# Patient Record
Sex: Female | Born: 1965 | Race: White | Hispanic: No | Marital: Married | Smoking: Never smoker
Health system: Southern US, Community
[De-identification: ages and names within clinical notes are randomized; demographics above are authoritative.]

## PROBLEM LIST (undated history)

## (undated) DIAGNOSIS — F419 Anxiety disorder, unspecified: Secondary | ICD-10-CM

## (undated) DIAGNOSIS — T753XXA Motion sickness, initial encounter: Secondary | ICD-10-CM

## (undated) DIAGNOSIS — Z9889 Other specified postprocedural states: Secondary | ICD-10-CM

## (undated) DIAGNOSIS — T4145XA Adverse effect of unspecified anesthetic, initial encounter: Secondary | ICD-10-CM

## (undated) DIAGNOSIS — R112 Nausea with vomiting, unspecified: Secondary | ICD-10-CM

## (undated) DIAGNOSIS — C801 Malignant (primary) neoplasm, unspecified: Secondary | ICD-10-CM

## (undated) DIAGNOSIS — R51 Headache: Secondary | ICD-10-CM

## (undated) DIAGNOSIS — F32A Depression, unspecified: Secondary | ICD-10-CM

## (undated) DIAGNOSIS — F329 Major depressive disorder, single episode, unspecified: Secondary | ICD-10-CM

## (undated) DIAGNOSIS — T8859XA Other complications of anesthesia, initial encounter: Secondary | ICD-10-CM

## (undated) HISTORY — PX: TONSILLECTOMY: SUR1361

---

## 1998-12-02 ENCOUNTER — Other Ambulatory Visit: Admission: RE | Admit: 1998-12-02 | Discharge: 1998-12-02 | Payer: Self-pay | Admitting: Obstetrics and Gynecology

## 2000-03-15 ENCOUNTER — Other Ambulatory Visit: Admission: RE | Admit: 2000-03-15 | Discharge: 2000-03-15 | Payer: Self-pay | Admitting: Obstetrics and Gynecology

## 2001-04-18 ENCOUNTER — Other Ambulatory Visit: Admission: RE | Admit: 2001-04-18 | Discharge: 2001-04-18 | Payer: Self-pay | Admitting: Obstetrics and Gynecology

## 2002-06-09 ENCOUNTER — Other Ambulatory Visit: Admission: RE | Admit: 2002-06-09 | Discharge: 2002-06-09 | Payer: Self-pay | Admitting: Obstetrics and Gynecology

## 2003-06-12 ENCOUNTER — Other Ambulatory Visit: Admission: RE | Admit: 2003-06-12 | Discharge: 2003-06-12 | Payer: Self-pay | Admitting: Obstetrics and Gynecology

## 2003-06-15 ENCOUNTER — Encounter (INDEPENDENT_AMBULATORY_CARE_PROVIDER_SITE_OTHER): Payer: Self-pay | Admitting: *Deleted

## 2003-06-15 ENCOUNTER — Encounter: Admission: RE | Admit: 2003-06-15 | Discharge: 2003-06-15 | Payer: Self-pay | Admitting: Obstetrics and Gynecology

## 2003-06-15 ENCOUNTER — Encounter (INDEPENDENT_AMBULATORY_CARE_PROVIDER_SITE_OTHER): Payer: Self-pay | Admitting: Diagnostic Radiology

## 2003-06-15 ENCOUNTER — Encounter: Payer: Self-pay | Admitting: Obstetrics and Gynecology

## 2003-07-05 ENCOUNTER — Encounter: Admission: RE | Admit: 2003-07-05 | Discharge: 2003-07-05 | Payer: Self-pay | Admitting: Oncology

## 2003-07-11 ENCOUNTER — Encounter (HOSPITAL_COMMUNITY): Admission: RE | Admit: 2003-07-11 | Discharge: 2003-10-09 | Payer: Self-pay | Admitting: General Surgery

## 2003-07-13 ENCOUNTER — Encounter: Payer: Self-pay | Admitting: Cardiology

## 2003-07-13 ENCOUNTER — Ambulatory Visit: Admission: RE | Admit: 2003-07-13 | Discharge: 2003-07-13 | Payer: Self-pay | Admitting: Oncology

## 2003-07-19 ENCOUNTER — Ambulatory Visit (HOSPITAL_COMMUNITY): Admission: RE | Admit: 2003-07-19 | Discharge: 2003-07-19 | Payer: Self-pay | Admitting: Surgery

## 2003-07-19 ENCOUNTER — Ambulatory Visit (HOSPITAL_BASED_OUTPATIENT_CLINIC_OR_DEPARTMENT_OTHER): Admission: RE | Admit: 2003-07-19 | Discharge: 2003-07-19 | Payer: Self-pay | Admitting: Surgery

## 2003-09-08 HISTORY — PX: MASTECTOMY W/ SENTINEL NODE BIOPSY: SHX2001

## 2003-09-08 HISTORY — PX: SIMPLE MASTECTOMY: SHX2409

## 2003-09-08 HISTORY — PX: BREAST RECONSTRUCTION: SHX9

## 2003-09-17 ENCOUNTER — Inpatient Hospital Stay (HOSPITAL_COMMUNITY): Admission: RE | Admit: 2003-09-17 | Discharge: 2003-09-19 | Payer: Self-pay | Admitting: Surgery

## 2003-09-17 ENCOUNTER — Encounter (INDEPENDENT_AMBULATORY_CARE_PROVIDER_SITE_OTHER): Payer: Self-pay | Admitting: *Deleted

## 2003-11-21 ENCOUNTER — Ambulatory Visit: Admission: RE | Admit: 2003-11-21 | Discharge: 2003-12-25 | Payer: Self-pay | Admitting: Radiation Oncology

## 2004-05-28 ENCOUNTER — Ambulatory Visit (HOSPITAL_COMMUNITY): Admission: RE | Admit: 2004-05-28 | Discharge: 2004-05-28 | Payer: Self-pay | Admitting: Oncology

## 2004-07-08 ENCOUNTER — Ambulatory Visit: Payer: Self-pay | Admitting: Oncology

## 2004-07-09 ENCOUNTER — Other Ambulatory Visit: Admission: RE | Admit: 2004-07-09 | Discharge: 2004-07-09 | Payer: Self-pay | Admitting: Obstetrics and Gynecology

## 2004-07-30 ENCOUNTER — Ambulatory Visit (HOSPITAL_COMMUNITY): Admission: RE | Admit: 2004-07-30 | Discharge: 2004-07-30 | Payer: Self-pay | Admitting: Oncology

## 2004-08-18 ENCOUNTER — Ambulatory Visit: Payer: Self-pay

## 2004-08-18 ENCOUNTER — Ambulatory Visit (HOSPITAL_COMMUNITY): Admission: RE | Admit: 2004-08-18 | Discharge: 2004-08-18 | Payer: Self-pay | Admitting: Oncology

## 2004-08-27 ENCOUNTER — Ambulatory Visit: Payer: Self-pay | Admitting: Oncology

## 2004-09-09 ENCOUNTER — Ambulatory Visit (HOSPITAL_BASED_OUTPATIENT_CLINIC_OR_DEPARTMENT_OTHER): Admission: RE | Admit: 2004-09-09 | Discharge: 2004-09-09 | Payer: Self-pay | Admitting: Plastic Surgery

## 2004-09-15 ENCOUNTER — Ambulatory Visit (HOSPITAL_COMMUNITY): Admission: RE | Admit: 2004-09-15 | Discharge: 2004-09-15 | Payer: Self-pay | Admitting: Oncology

## 2004-10-14 ENCOUNTER — Ambulatory Visit: Payer: Self-pay | Admitting: Oncology

## 2004-11-24 ENCOUNTER — Ambulatory Visit: Payer: Self-pay

## 2004-12-02 ENCOUNTER — Ambulatory Visit: Payer: Self-pay | Admitting: Oncology

## 2004-12-25 ENCOUNTER — Ambulatory Visit (HOSPITAL_BASED_OUTPATIENT_CLINIC_OR_DEPARTMENT_OTHER): Admission: RE | Admit: 2004-12-25 | Discharge: 2004-12-25 | Payer: Self-pay | Admitting: Plastic Surgery

## 2005-01-12 ENCOUNTER — Ambulatory Visit (HOSPITAL_BASED_OUTPATIENT_CLINIC_OR_DEPARTMENT_OTHER): Admission: RE | Admit: 2005-01-12 | Discharge: 2005-01-12 | Payer: Self-pay | Admitting: Plastic Surgery

## 2005-01-20 ENCOUNTER — Ambulatory Visit: Payer: Self-pay | Admitting: Oncology

## 2005-02-16 ENCOUNTER — Ambulatory Visit: Payer: Self-pay

## 2005-03-09 ENCOUNTER — Ambulatory Visit: Payer: Self-pay | Admitting: Oncology

## 2005-04-28 ENCOUNTER — Ambulatory Visit: Payer: Self-pay | Admitting: Oncology

## 2005-06-08 ENCOUNTER — Ambulatory Visit (HOSPITAL_BASED_OUTPATIENT_CLINIC_OR_DEPARTMENT_OTHER): Admission: RE | Admit: 2005-06-08 | Discharge: 2005-06-08 | Payer: Self-pay | Admitting: Surgery

## 2005-07-21 ENCOUNTER — Ambulatory Visit: Payer: Self-pay | Admitting: Oncology

## 2005-09-07 HISTORY — PX: PORT-A-CATH REMOVAL: SHX5289

## 2005-09-07 HISTORY — PX: ABDOMINAL HYSTERECTOMY: SHX81

## 2005-10-09 ENCOUNTER — Ambulatory Visit: Payer: Self-pay | Admitting: Oncology

## 2006-03-19 ENCOUNTER — Ambulatory Visit: Payer: Self-pay | Admitting: Oncology

## 2006-03-19 LAB — CBC WITH DIFFERENTIAL/PLATELET
BASO%: 0.8 % (ref 0.0–2.0)
EOS%: 0.1 % (ref 0.0–7.0)
HCT: 40.1 % (ref 34.8–46.6)
LYMPH%: 21 % (ref 14.0–48.0)
MCH: 28.9 pg (ref 26.0–34.0)
MCHC: 33.8 g/dL (ref 32.0–36.0)
NEUT%: 71 % (ref 39.6–76.8)
RBC: 4.7 10*6/uL (ref 3.70–5.32)
lymph#: 1.3 10*3/uL (ref 0.9–3.3)

## 2006-03-19 LAB — COMPREHENSIVE METABOLIC PANEL
ALT: 10 U/L (ref 0–40)
BUN: 13 mg/dL (ref 6–23)
CO2: 26 mEq/L (ref 19–32)
Creatinine, Ser: 0.68 mg/dL (ref 0.40–1.20)
Glucose, Bld: 80 mg/dL (ref 70–99)
Total Bilirubin: 0.4 mg/dL (ref 0.3–1.2)

## 2006-06-07 ENCOUNTER — Ambulatory Visit: Payer: Self-pay | Admitting: Oncology

## 2006-08-27 ENCOUNTER — Ambulatory Visit (HOSPITAL_COMMUNITY): Admission: RE | Admit: 2006-08-27 | Discharge: 2006-08-28 | Payer: Self-pay | Admitting: Obstetrics and Gynecology

## 2006-08-27 ENCOUNTER — Encounter (INDEPENDENT_AMBULATORY_CARE_PROVIDER_SITE_OTHER): Payer: Self-pay | Admitting: Specialist

## 2006-09-09 ENCOUNTER — Ambulatory Visit: Payer: Self-pay | Admitting: Oncology

## 2006-09-15 LAB — COMPREHENSIVE METABOLIC PANEL
CO2: 23 mEq/L (ref 19–32)
Calcium: 9.5 mg/dL (ref 8.4–10.5)
Chloride: 108 mEq/L (ref 96–112)
Creatinine, Ser: 0.66 mg/dL (ref 0.40–1.20)
Glucose, Bld: 85 mg/dL (ref 70–99)
Total Bilirubin: 0.3 mg/dL (ref 0.3–1.2)

## 2006-09-15 LAB — CBC WITH DIFFERENTIAL/PLATELET
Basophils Absolute: 0 10*3/uL (ref 0.0–0.1)
Eosinophils Absolute: 0.2 10*3/uL (ref 0.0–0.5)
HCT: 39.7 % (ref 34.8–46.6)
HGB: 13.4 g/dL (ref 11.6–15.9)
LYMPH%: 26.6 % (ref 14.0–48.0)
MONO#: 0.2 10*3/uL (ref 0.1–0.9)
NEUT#: 3.4 10*3/uL (ref 1.5–6.5)
NEUT%: 63.9 % (ref 39.6–76.8)
Platelets: ADEQUATE 10*3/uL (ref 145–400)
WBC: 5.3 10*3/uL (ref 3.9–10.0)
lymph#: 1.4 10*3/uL (ref 0.9–3.3)

## 2006-11-09 IMAGING — CR DG CHEST 2V
2 series · 2 of 2 positions shown · non-contrast
Comparison: 05/28/04.

CLINICAL DATA: History of breast cancer.  Pain between shoulders. 
 CHEST - 2 VIEW, 07/30/04:

[view not recorded (1 of 2)]
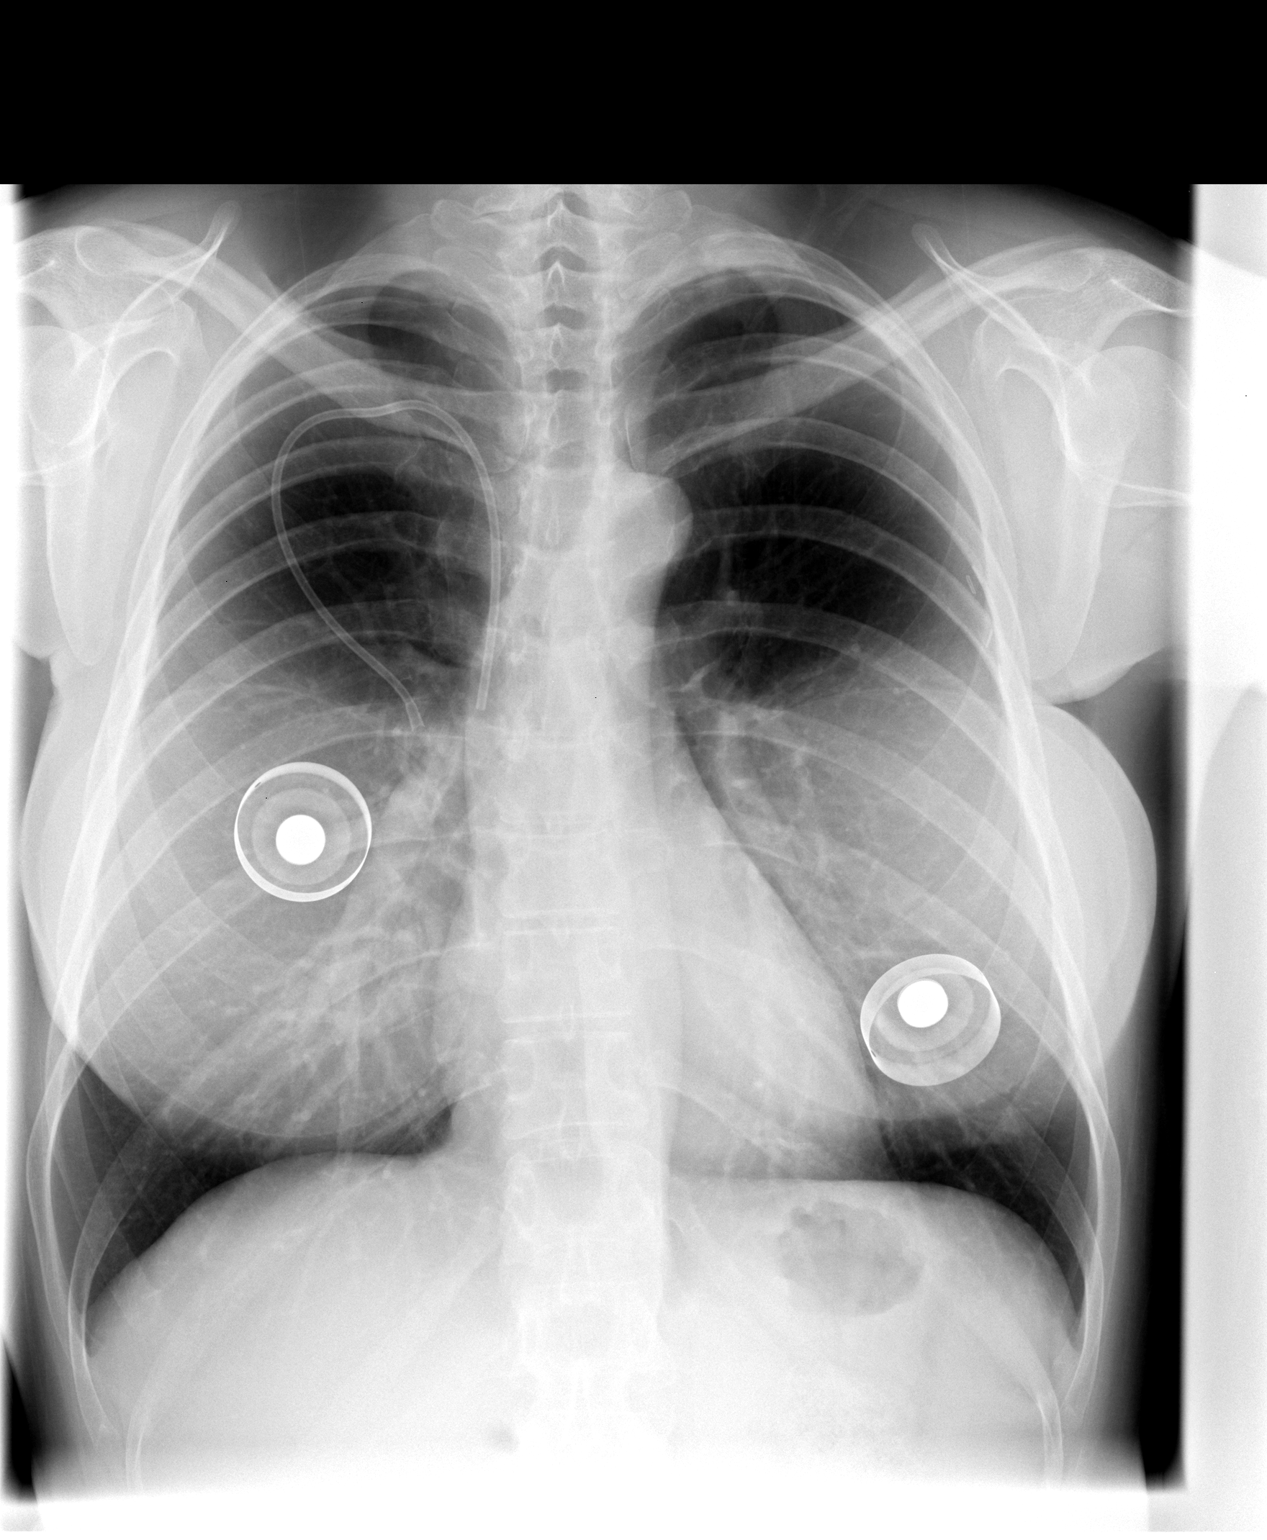

[view not recorded (2 of 2)]
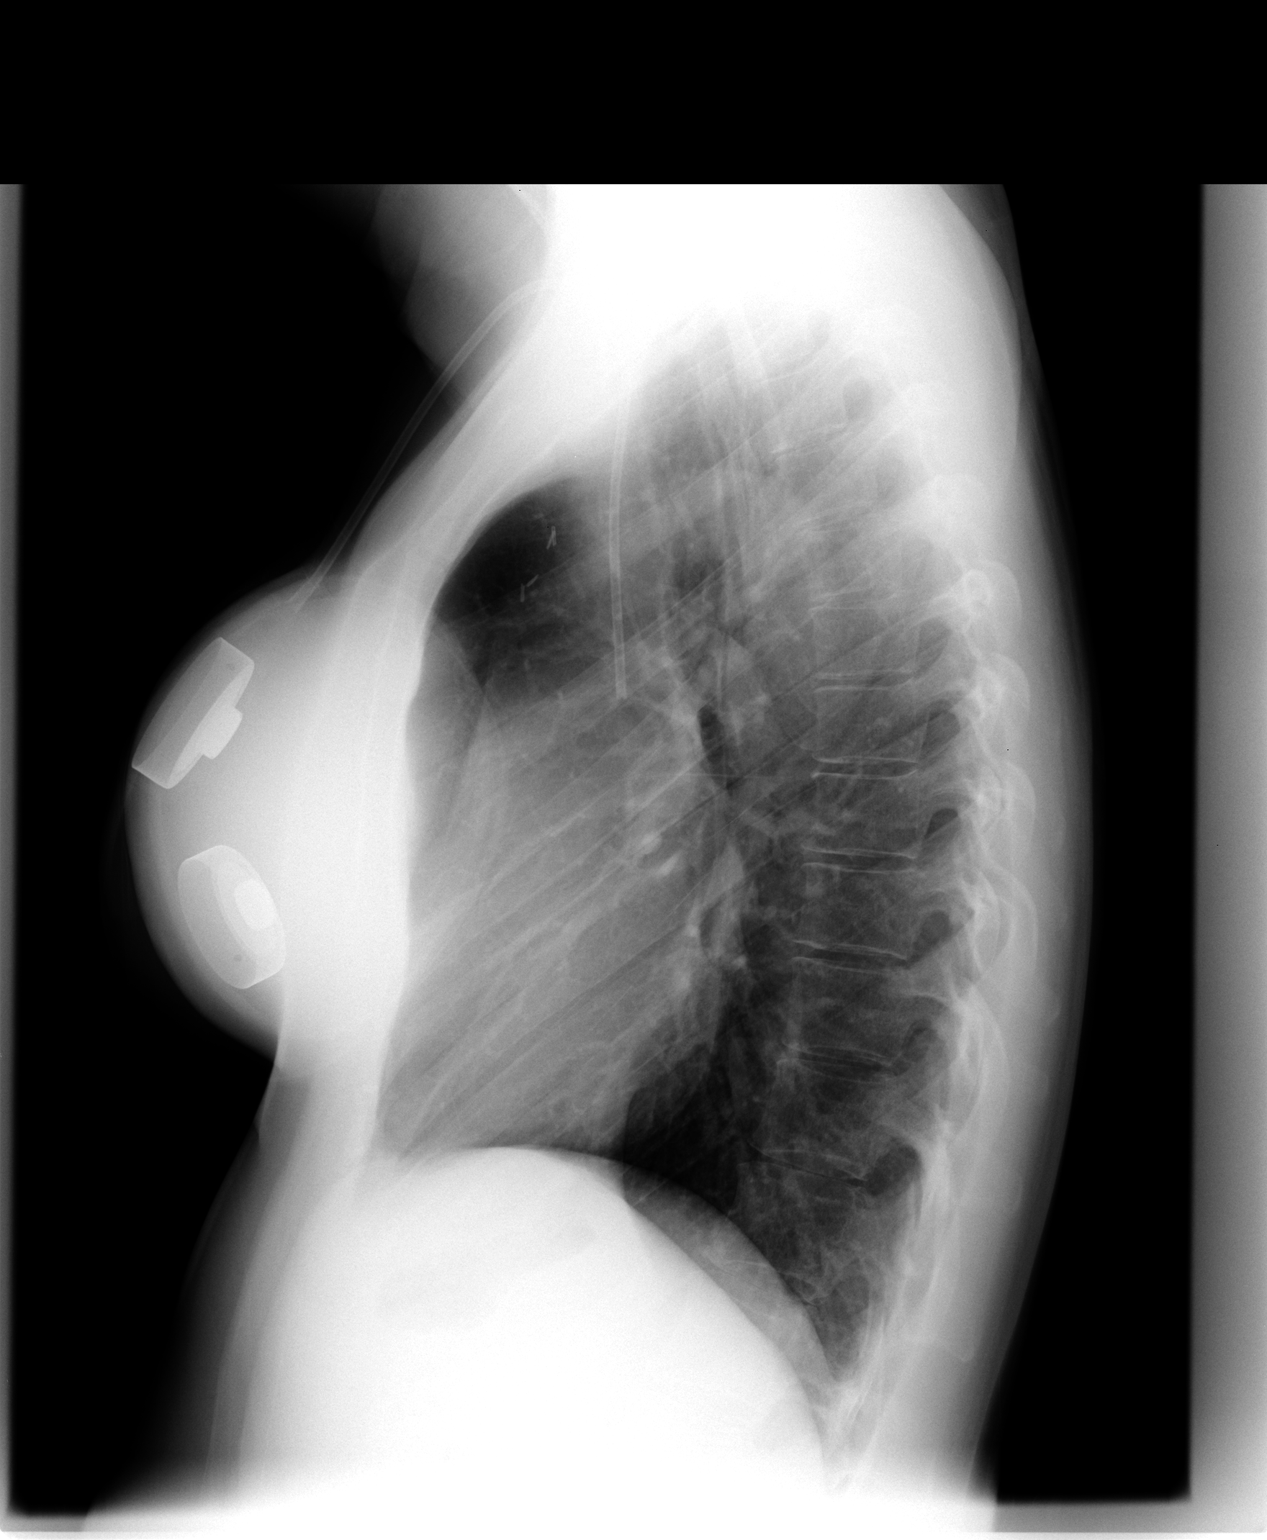

[2 of 2 positions shown; findings below may reference images not displayed]

FINDINGS: Two-view exam of the chest shows interval placement of bilateral breast prostheses.  There is no focal consolidation, edema, or effusion.  Surgical clips project in the left axillary region.  Right-sided Port-A-Cath remains in place.
IMPRESSION: No evidence of acute cardiopulmonary process.

## 2006-11-09 IMAGING — CR DG THORACIC SPINE 2V
3 series · 3 of 3 positions shown · non-contrast
Comparison: None.

CLINICAL DATA: Mid back pain. 
 TWO-VIEW THORACIC SPINE, 07/30/04:

[view not recorded (1 of 3)]
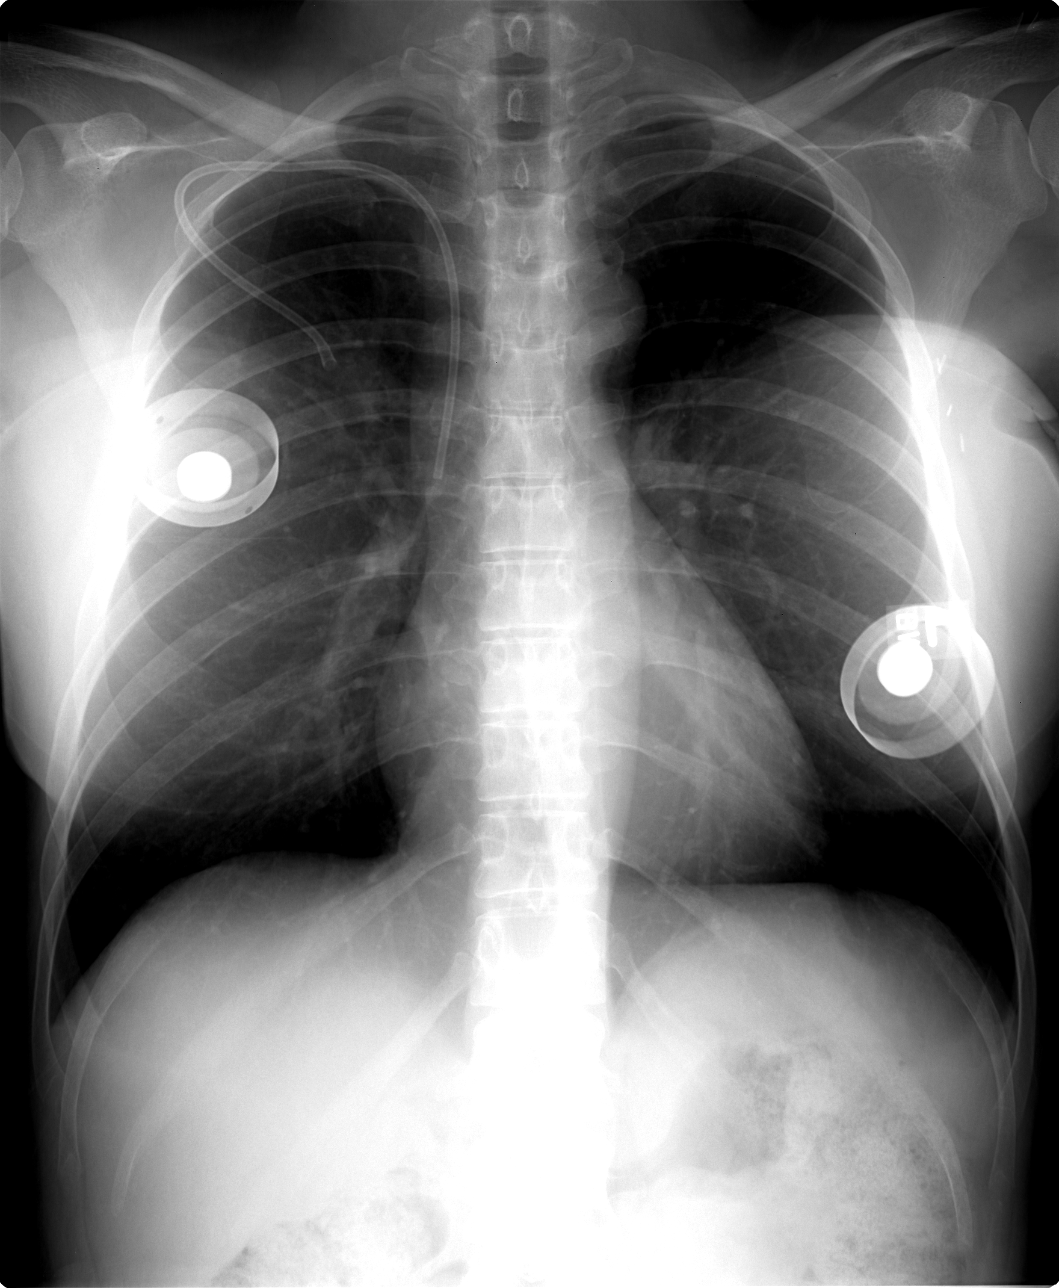

[view not recorded (2 of 3)]
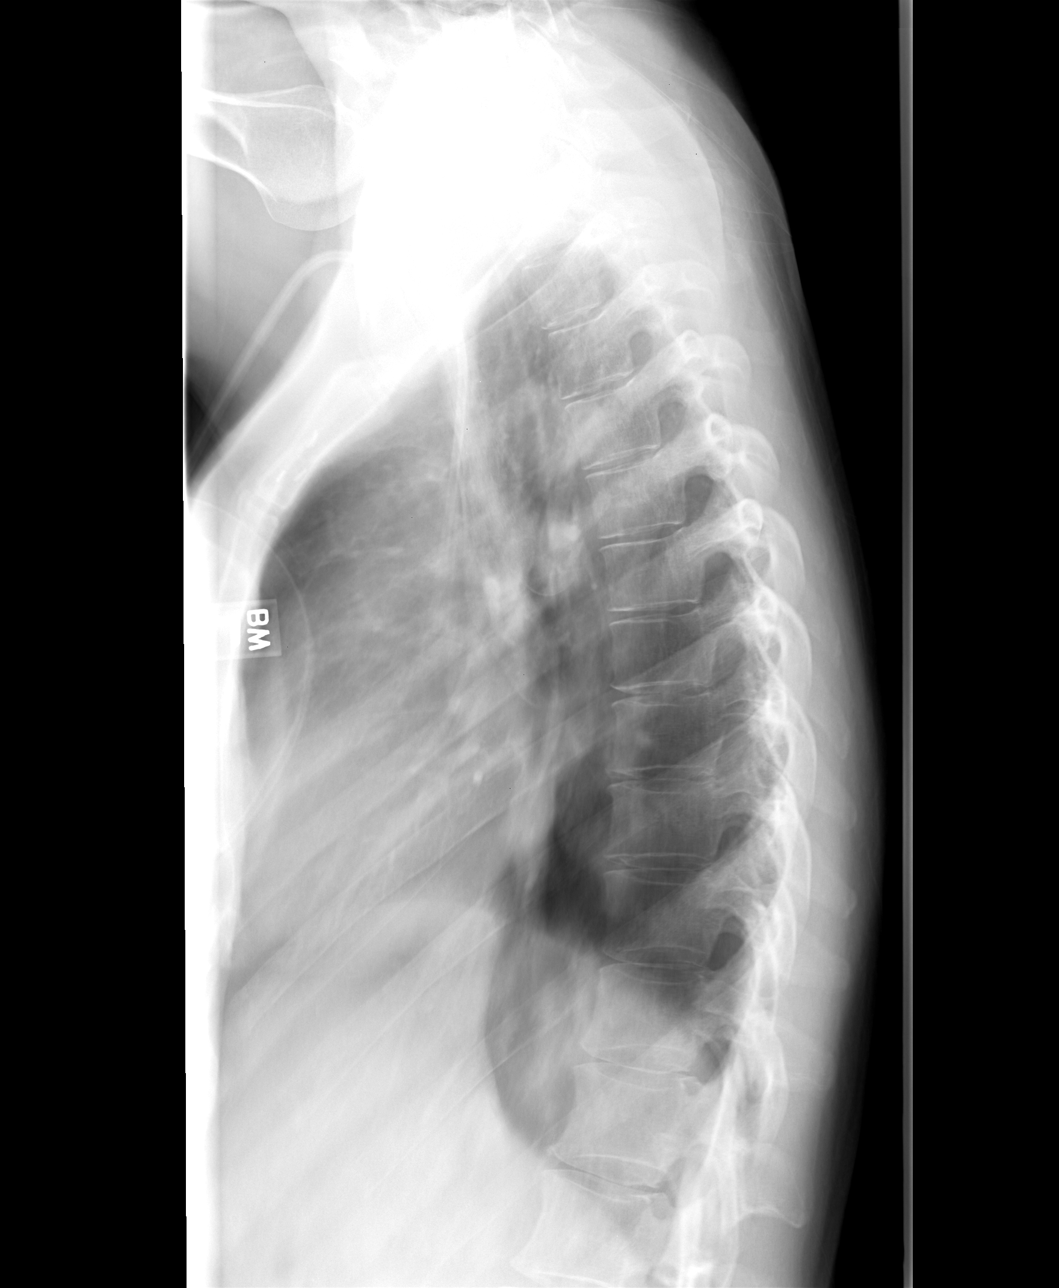

[view not recorded (3 of 3)]
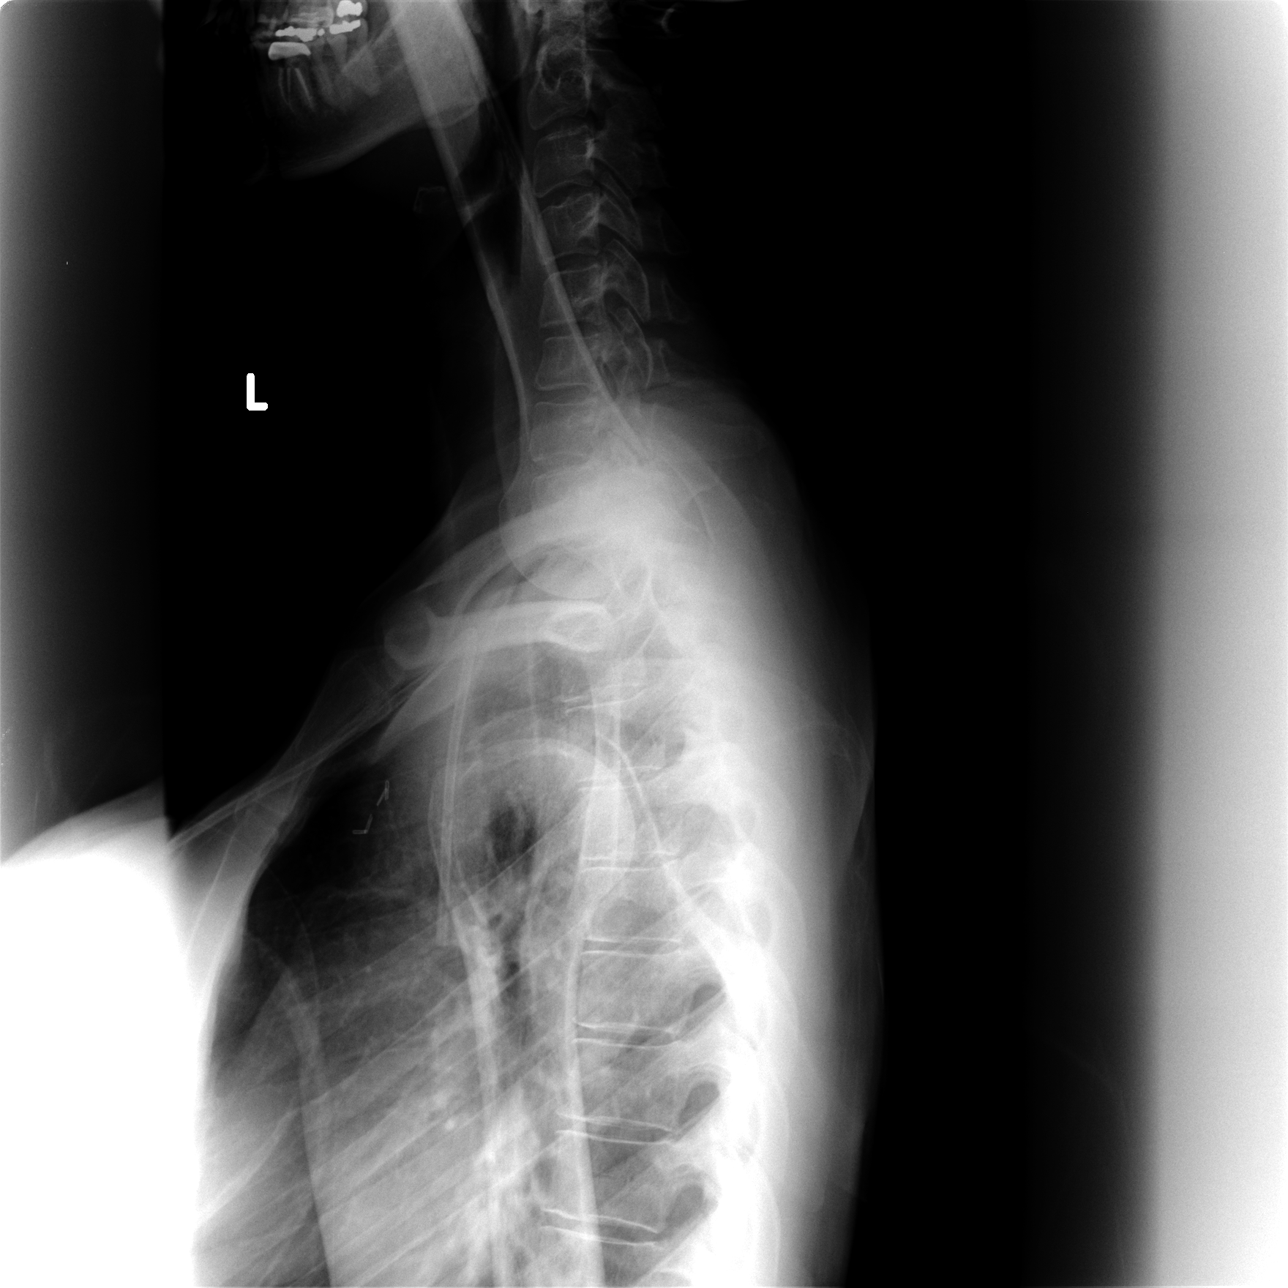

[3 of 3 positions shown; findings below may reference images not displayed]

FINDINGS: Two-view exam of the thoracic spine shows no acute fracture or subluxation.  The intervertebral disk spaces are preserved.  No focal lytic or sclerotic osseous lesions are identified. 
 Bilateral breast prostheses are noted, and the patient has a right-sided Port-A-Cath.
IMPRESSION: No acute findings in the thoracic spine.  If there is clinical concern for bony metastatic involvement, nuclear scintigraphy may be a more sensitive means to evaluate.

## 2007-01-03 ENCOUNTER — Ambulatory Visit (HOSPITAL_COMMUNITY): Admission: RE | Admit: 2007-01-03 | Discharge: 2007-01-03 | Payer: Self-pay | Admitting: Oncology

## 2007-01-03 ENCOUNTER — Ambulatory Visit: Payer: Self-pay | Admitting: Oncology

## 2007-01-03 LAB — COMPREHENSIVE METABOLIC PANEL
ALT: 10 U/L (ref 0–35)
Albumin: 4.8 g/dL (ref 3.5–5.2)
CO2: 28 mEq/L (ref 19–32)
Calcium: 9.5 mg/dL (ref 8.4–10.5)
Chloride: 107 mEq/L (ref 96–112)
Glucose, Bld: 103 mg/dL — ABNORMAL HIGH (ref 70–99)
Sodium: 144 mEq/L (ref 135–145)
Total Bilirubin: 0.4 mg/dL (ref 0.3–1.2)
Total Protein: 7.5 g/dL (ref 6.0–8.3)

## 2007-01-03 LAB — CBC WITH DIFFERENTIAL/PLATELET
BASO%: 0.4 % (ref 0.0–2.0)
Eosinophils Absolute: 0 10*3/uL (ref 0.0–0.5)
HCT: 40.7 % (ref 34.8–46.6)
LYMPH%: 16.9 % (ref 14.0–48.0)
MCHC: 34.6 g/dL (ref 32.0–36.0)
MONO#: 0.4 10*3/uL (ref 0.1–0.9)
NEUT#: 4.5 10*3/uL (ref 1.5–6.5)
Platelets: 231 10*3/uL (ref 145–400)
RBC: 4.95 10*6/uL (ref 3.70–5.32)
WBC: 6 10*3/uL (ref 3.9–10.0)
lymph#: 1 10*3/uL (ref 0.9–3.3)

## 2007-01-07 ENCOUNTER — Ambulatory Visit (HOSPITAL_COMMUNITY): Admission: RE | Admit: 2007-01-07 | Discharge: 2007-01-07 | Payer: Self-pay | Admitting: Oncology

## 2007-03-21 ENCOUNTER — Ambulatory Visit: Payer: Self-pay | Admitting: Oncology

## 2007-03-23 LAB — COMPREHENSIVE METABOLIC PANEL
Albumin: 4.8 g/dL (ref 3.5–5.2)
Alkaline Phosphatase: 56 U/L (ref 39–117)
BUN: 14 mg/dL (ref 6–23)
CO2: 23 mEq/L (ref 19–32)
Calcium: 10 mg/dL (ref 8.4–10.5)
Chloride: 107 mEq/L (ref 96–112)
Glucose, Bld: 73 mg/dL (ref 70–99)
Potassium: 3.9 mEq/L (ref 3.5–5.3)
Sodium: 143 mEq/L (ref 135–145)
Total Protein: 7.8 g/dL (ref 6.0–8.3)

## 2007-03-23 LAB — CBC WITH DIFFERENTIAL/PLATELET
BASO%: 1.5 % (ref 0.0–2.0)
Eosinophils Absolute: 0 10*3/uL (ref 0.0–0.5)
LYMPH%: 29.6 % (ref 14.0–48.0)
MCHC: 33.4 g/dL (ref 32.0–36.0)
MONO#: 0.4 10*3/uL (ref 0.1–0.9)
NEUT#: 3.2 10*3/uL (ref 1.5–6.5)
RBC: 4.98 10*6/uL (ref 3.70–5.32)
RDW: 10.9 % — ABNORMAL LOW (ref 11.3–14.5)
WBC: 5.2 10*3/uL (ref 3.9–10.0)
lymph#: 1.6 10*3/uL (ref 0.9–3.3)

## 2007-08-19 ENCOUNTER — Ambulatory Visit: Payer: Self-pay | Admitting: Oncology

## 2007-08-23 LAB — CBC WITH DIFFERENTIAL/PLATELET
BASO%: 0.4 % (ref 0.0–2.0)
Basophils Absolute: 0 10*3/uL (ref 0.0–0.1)
LYMPH%: 30.2 % (ref 14.0–48.0)
MCV: 83.2 fL (ref 81.0–101.0)
MONO#: 0.3 10*3/uL (ref 0.1–0.9)
NEUT#: 2.6 10*3/uL (ref 1.5–6.5)
NEUT%: 62.2 % (ref 39.6–76.8)
RBC: 4.88 10*6/uL (ref 3.70–5.32)

## 2007-08-23 LAB — COMPREHENSIVE METABOLIC PANEL
ALT: 15 U/L (ref 0–35)
BUN: 19 mg/dL (ref 6–23)
CO2: 25 mEq/L (ref 19–32)
Calcium: 10.2 mg/dL (ref 8.4–10.5)
Creatinine, Ser: 0.79 mg/dL (ref 0.40–1.20)
Total Bilirubin: 0.4 mg/dL (ref 0.3–1.2)

## 2007-09-05 ENCOUNTER — Encounter: Admission: RE | Admit: 2007-09-05 | Discharge: 2007-09-05 | Payer: Self-pay | Admitting: Oncology

## 2008-02-23 ENCOUNTER — Ambulatory Visit: Payer: Self-pay | Admitting: Oncology

## 2008-02-27 LAB — COMPREHENSIVE METABOLIC PANEL
ALT: 18 U/L (ref 0–35)
AST: 18 U/L (ref 0–37)
Albumin: 4.4 g/dL (ref 3.5–5.2)
CO2: 24 mEq/L (ref 19–32)
Calcium: 9.1 mg/dL (ref 8.4–10.5)
Chloride: 106 mEq/L (ref 96–112)
Creatinine, Ser: 0.67 mg/dL (ref 0.40–1.20)
Potassium: 3.9 mEq/L (ref 3.5–5.3)
Sodium: 143 mEq/L (ref 135–145)
Total Protein: 7.2 g/dL (ref 6.0–8.3)

## 2008-02-27 LAB — CBC WITH DIFFERENTIAL/PLATELET
BASO%: 0.2 % (ref 0.0–2.0)
EOS%: 0 % (ref 0.0–7.0)
MCH: 28.7 pg (ref 26.0–34.0)
MCHC: 34.6 g/dL (ref 32.0–36.0)
MONO#: 0.3 10*3/uL (ref 0.1–0.9)
RBC: 4.93 10*6/uL (ref 3.70–5.32)
RDW: 12.4 % (ref 11.3–14.5)
WBC: 4.7 10*3/uL (ref 3.9–10.0)
lymph#: 1.1 10*3/uL (ref 0.9–3.3)

## 2008-08-16 ENCOUNTER — Ambulatory Visit: Payer: Self-pay | Admitting: Oncology

## 2008-08-20 LAB — CBC WITH DIFFERENTIAL/PLATELET
BASO%: 0.4 % (ref 0.0–2.0)
Basophils Absolute: 0 10*3/uL (ref 0.0–0.1)
EOS%: 0 % (ref 0.0–7.0)
HCT: 42 % (ref 34.8–46.6)
HGB: 14.3 g/dL (ref 11.6–15.9)
MCH: 28.7 pg (ref 26.0–34.0)
MCHC: 34.1 g/dL (ref 32.0–36.0)
MONO#: 0.3 10*3/uL (ref 0.1–0.9)
NEUT%: 66.5 % (ref 39.6–76.8)
RDW: 12.6 % (ref 11.3–14.5)
WBC: 4.7 10*3/uL (ref 3.9–10.0)
lymph#: 1.2 10*3/uL (ref 0.9–3.3)

## 2008-08-20 LAB — COMPREHENSIVE METABOLIC PANEL
ALT: 17 U/L (ref 0–35)
AST: 17 U/L (ref 0–37)
Albumin: 4.8 g/dL (ref 3.5–5.2)
CO2: 27 mEq/L (ref 19–32)
Calcium: 10.2 mg/dL (ref 8.4–10.5)
Chloride: 104 mEq/L (ref 96–112)
Creatinine, Ser: 0.71 mg/dL (ref 0.40–1.20)
Potassium: 4.3 mEq/L (ref 3.5–5.3)

## 2009-02-07 ENCOUNTER — Ambulatory Visit: Payer: Self-pay | Admitting: Oncology

## 2009-02-11 LAB — CBC WITH DIFFERENTIAL/PLATELET
BASO%: 0.6 % (ref 0.0–2.0)
Basophils Absolute: 0 10*3/uL (ref 0.0–0.1)
EOS%: 3.6 % (ref 0.0–7.0)
Eosinophils Absolute: 0.2 10*3/uL (ref 0.0–0.5)
HCT: 42 % (ref 34.8–46.6)
HGB: 14.2 g/dL (ref 11.6–15.9)
LYMPH%: 25.3 % (ref 14.0–49.7)
MCH: 28 pg (ref 25.1–34.0)
MCHC: 33.8 g/dL (ref 31.5–36.0)
MCV: 82.8 fL (ref 79.5–101.0)
MONO#: 0.4 10*3/uL (ref 0.1–0.9)
MONO%: 8.2 % (ref 0.0–14.0)
NEUT#: 3 10*3/uL (ref 1.5–6.5)
NEUT%: 62.3 % (ref 38.4–76.8)
Platelets: 198 10*3/uL (ref 145–400)
RBC: 5.07 10*6/uL (ref 3.70–5.45)
RDW: 12.6 % (ref 11.2–14.5)
WBC: 4.8 10*3/uL (ref 3.9–10.3)
lymph#: 1.2 10*3/uL (ref 0.9–3.3)

## 2009-02-11 LAB — COMPREHENSIVE METABOLIC PANEL
ALT: 23 U/L (ref 0–35)
AST: 20 U/L (ref 0–37)
Albumin: 4.6 g/dL (ref 3.5–5.2)
Alkaline Phosphatase: 73 U/L (ref 39–117)
Chloride: 108 mEq/L (ref 96–112)
Potassium: 3.9 mEq/L (ref 3.5–5.3)
Sodium: 141 mEq/L (ref 135–145)
Total Protein: 7.5 g/dL (ref 6.0–8.3)

## 2009-08-08 ENCOUNTER — Ambulatory Visit: Payer: Self-pay | Admitting: Oncology

## 2009-08-12 LAB — CBC WITH DIFFERENTIAL/PLATELET
Basophils Absolute: 0 10*3/uL (ref 0.0–0.1)
Eosinophils Absolute: 0 10*3/uL (ref 0.0–0.5)
HCT: 41.9 % (ref 34.8–46.6)
MCH: 29.4 pg (ref 25.1–34.0)
MCHC: 34.6 g/dL (ref 31.5–36.0)
MONO%: 5.5 % (ref 0.0–14.0)
Platelets: 209 10*3/uL (ref 145–400)
RDW: 12 % (ref 11.2–14.5)
lymph#: 1.4 10*3/uL (ref 0.9–3.3)

## 2009-08-12 LAB — COMPREHENSIVE METABOLIC PANEL
AST: 17 U/L (ref 0–37)
Albumin: 4.9 g/dL (ref 3.5–5.2)
Alkaline Phosphatase: 83 U/L (ref 39–117)
BUN: 13 mg/dL (ref 6–23)
Calcium: 9.7 mg/dL (ref 8.4–10.5)
Chloride: 106 mEq/L (ref 96–112)
Glucose, Bld: 93 mg/dL (ref 70–99)
Potassium: 3.9 mEq/L (ref 3.5–5.3)
Sodium: 143 mEq/L (ref 135–145)
Total Protein: 7.6 g/dL (ref 6.0–8.3)

## 2009-08-15 ENCOUNTER — Ambulatory Visit (HOSPITAL_COMMUNITY): Admission: RE | Admit: 2009-08-15 | Discharge: 2009-08-15 | Payer: Self-pay | Admitting: Oncology

## 2010-02-13 ENCOUNTER — Ambulatory Visit: Payer: Self-pay | Admitting: Oncology

## 2010-02-17 LAB — COMPREHENSIVE METABOLIC PANEL
ALT: 19 U/L (ref 0–35)
AST: 22 U/L (ref 0–37)
Albumin: 4.7 g/dL (ref 3.5–5.2)
Alkaline Phosphatase: 81 U/L (ref 39–117)
BUN: 15 mg/dL (ref 6–23)
CO2: 23 mEq/L (ref 19–32)
Calcium: 10.1 mg/dL (ref 8.4–10.5)
Chloride: 104 mEq/L (ref 96–112)
Creatinine, Ser: 0.8 mg/dL (ref 0.40–1.20)
Glucose, Bld: 99 mg/dL (ref 70–99)
Potassium: 3.9 mEq/L (ref 3.5–5.3)
Sodium: 142 mEq/L (ref 135–145)
Total Bilirubin: 0.4 mg/dL (ref 0.3–1.2)
Total Protein: 7.8 g/dL (ref 6.0–8.3)

## 2010-02-17 LAB — CBC WITH DIFFERENTIAL/PLATELET
BASO%: 0.6 % (ref 0.0–2.0)
Basophils Absolute: 0 10*3/uL (ref 0.0–0.1)
EOS%: 2.7 % (ref 0.0–7.0)
Eosinophils Absolute: 0.1 10*3/uL (ref 0.0–0.5)
HCT: 41.5 % (ref 34.8–46.6)
HGB: 14.1 g/dL (ref 11.6–15.9)
LYMPH%: 32 % (ref 14.0–49.7)
MCH: 28.4 pg (ref 25.1–34.0)
MCHC: 34 g/dL (ref 31.5–36.0)
MCV: 83.5 fL (ref 79.5–101.0)
MONO#: 0.3 10*3/uL (ref 0.1–0.9)
MONO%: 5.9 % (ref 0.0–14.0)
NEUT#: 2.8 10*3/uL (ref 1.5–6.5)
NEUT%: 58.8 % (ref 38.4–76.8)
Platelets: 191 10*3/uL (ref 145–400)
RBC: 4.97 10*6/uL (ref 3.70–5.45)
RDW: 12.6 % (ref 11.2–14.5)
WBC: 4.8 10*3/uL (ref 3.9–10.3)
lymph#: 1.5 10*3/uL (ref 0.9–3.3)
nRBC: 0 % (ref 0–0)

## 2010-04-17 ENCOUNTER — Ambulatory Visit (HOSPITAL_COMMUNITY): Admission: RE | Admit: 2010-04-17 | Discharge: 2010-04-17 | Payer: Self-pay | Admitting: Oncology

## 2010-04-18 ENCOUNTER — Ambulatory Visit: Payer: Self-pay | Admitting: Oncology

## 2010-09-27 ENCOUNTER — Encounter: Payer: Self-pay | Admitting: Oncology

## 2010-12-09 LAB — GLUCOSE, CAPILLARY: Glucose-Capillary: 83 mg/dL (ref 70–99)

## 2011-01-23 NOTE — H&P (Signed)
NAME:  Kristine Ortega, Kristine Ortega.:  [id number]   MEDICAL RECORD NO.:  [id number]          PATIENT TYPE:  AMB   LOCATION:  SDC                           FACILITY:  [facility]   PHYSICIAN:  Juluis Mire, M.D.   DATE OF BIRTH:  11-24-1965   DATE OF ADMISSION:  08/27/2006  DATE OF DISCHARGE:                              HISTORY & PHYSICAL   HISTORY OF PRESENT ILLNESS:  The patient is a 45 year old gravida 5,  para 4, abortus 1, married female, who presents for laparoscopic-  assisted vaginal hysterectomy with bilateral salpingo-oophorectomy and  mid urethral sling.   In relation to the present admission, the patient has had a past history  of breast cancer resulting in a left mastectomy with implant.  She  subsequently underwent screening and was positive for the BRCA gene,  specifically BRCA2.  She subsequently had the remaining breast removed  and an implant put in.  Because of concerns about ovaries, we have been  evaluating her.  Her ultrasound has remained basically unremarkable.  However, in view of her concern, she decided to proceed with  laparoscopic-assisted vaginal hysterectomy and have both ovaries removed  at that time.   The patient is also troubled with stress urinary incontinence.  She  underwent urodynamics testing here in the office that was consistent  with urethral hypermobility.  She had a normal voiding pattern.  Her  volumes were slightly increased.  There was no evidence of overactive  bladder.  She had normal leak point pressures.  Her urethral pressure  profiles were normal.  She had no evidence of an obstructive voiding  pattern and her postvoid residual was minimal.   ALLERGIES:  She has no known drug allergies.   MEDICATION:  Boniva, vitamin C, calcium, vitamin D and vitamin E.   PAST MEDICAL HISTORY:  Significant in that she did have breast cancer  for which she underwent the above-noted surgery and she is positive for  the BRCA2 gene.   PAST SURGICAL HISTORY:  Includes TMJ and tonsillectomy.   PAST OBSTETRICAL HISTORY:  She has had 4 cesarean sections, 1 with a  bilateral tubal ligation.  She also had 1 miscarriage.   SOCIAL HISTORY:  Reveals no tobacco or alcohol use.   FAMILY HISTORY:  Noncontributory.   REVIEW OF SYSTEMS:  Noncontributory.   PHYSICAL EXAM:  VITAL SIGNS:  The patient is afebrile with stable vital  signs.  HEENT:  The patient is normocephalic.  Pupils are equal, round, and  reactive to light and accommodation.  Extraocular movements were intact.  Sclerae and conjunctivae were clear.  Oropharynx clear.  NECK:  Without thyromegaly.  BREASTS:  Bilateral implants are noted after previous mastectomies.  No  palpable abnormalities.  LUNGS:  Clear.  CARDIOVASCULAR:  Regular rhythm and rate without murmurs or gallops.  ABDOMEN:  Exam is benign.  PELVIC:  Normal external genitalia.  Vaginal mucosa is clear.  Cervix  unremarkable.  Uterus normal size, shape and contour.  Adnexa free of  masses or tenderness.  Rectovaginal was clear.  EXTREMITIES:  Trace  edema.  NEUROLOGIC:  Exam was grossly within normal limits.   IMPRESSION:  1. Personal history of BRCA2 genealogy.  2. History of breast cancer.  3. Stress urinary incontinence.   PLAN:  The patient will undergo the above-noted surgery.  The risk of  surgery have been discussed including the risk of infection, the risk of  hemorrhage that could require transfusion with the risk of AIDS or  hepatitis, the risk of injury to adjacent organs including bladder,  bowel or ureters that could require further exploratory surgery, the  risk of deep venous thrombosis and pulmonary embolus.  In terms of the  bladder suspension, the risks of inability to void requiring long-term  catheterization have been explained with the possible need to remove the  sling, leading to a return of incontinence have also been discussed.  Potential risk of erosion into the  ureter, bladder or vagina have been  explained, the risk of obturator nerve injury during placement that  could lead to problems with chronic leg pain and discomfort.  There is  also the risk of causing bladder spasms after surgery requiring medical  management.  The patient expressed understanding of indications and  risks.      Juluis Mire, M.D.  Electronically Signed     JSM/MEDQ  D:  08/27/2006  T:  08/27/2006  Job:  [id number]

## 2011-01-23 NOTE — Discharge Summary (Signed)
NAME:  Kristine Ortega, ILDEFONSO.:  [id number]   MEDICAL RECORD NO.:  [id number]          PATIENT TYPE:  OIB   LOCATION:  9302                          FACILITY:  [facility]   PHYSICIAN:  Juluis Mire, M.D.   DATE OF BIRTH:  Apr 17, 1966   DATE OF ADMISSION:  08/27/2006  DATE OF DISCHARGE:  08/28/2006                               DISCHARGE SUMMARY   ADMISSION DIAGNOSES:  1. Patient with a personal history of breast cancer with positive BRCA-      2 gene.  2. Stress urinary incontinence.   POSTOPERATIVE DIAGNOSES:  1. Patient with a personal history of breast cancer with positive BRCA-      2 gene.  2. Stress urinary incontinence.   OPERATIVE PROCEDURE:  Laparoscopically assisted vaginal hysterectomy  with bilateral salpingo-oophorectomy. Mid urethral sling using an  obturator approach. Cystoscopy.   For complete history and physical, please see dictated note.   COURSE IN THE HOSPITAL:  The patient underwent the above-noted surgery.  Pathology still pending. Postoperatively, she did extremely well. Her  postoperative hemoglobin was 10.5. She was able to void adequate amounts  on her first postoperative morning with minimal residuals. She will be  discharged home at this time. At the time of discharge, she was afebrile  with stable vital signs. Abdomen was soft and nontender. Bowel sounds  were active. All incisions were intact. She had no active vaginal  bleeding and was voiding without difficulty. Was actually tolerating a  regular diet at that time.   In terms of complications, none were encountered during her stay in the  hospital ___________ stable condition.   DISPOSITION:  Routine postoperative instructions were given. She is to  avoid heavy lifting, vaginal entry or driving a car. She is to watch for  signs of infection, nausea, vomiting, increase in abdominal pain, active  vaginal bleeding, signs of deep vein thrombosis or pulmonary embolus as  well as urinary  retention. She is discharged home with Tylox as needed  for pain and a short course of ciprofloxacin. She will be followed up in  the office in approximately a week.      Juluis Mire, M.D.  Electronically Signed     JSM/MEDQ  D:  08/28/2006  T:  08/28/2006  Job:  [id number]

## 2011-01-23 NOTE — Op Note (Signed)
NAME:  Kristine Ortega, BOLLS.:  [id number]   MEDICAL RECORD NO.:  [id number]          PATIENT TYPE:  AMB   LOCATION:  SDC                           [facility]   PHYSICIAN:  Juluis Mire, M.D.   DATE OF BIRTH:  08/07/1966   DATE OF PROCEDURE:  08/27/2006  DATE OF DISCHARGE:                               OPERATIVE REPORT   PREOPERATIVE DIAGNOSES:  1. Personal history of positive screening for BRCA2 gene.  2. Stress urinary incontinence.   POSTOPERATIVE DIAGNOSES:  1. Personal history of positive screening for BRCA2 gene.  2. Stress urinary incontinence.   OPERATIVE PROCEDURE:  1. Laparoscopically assisted vaginal hysterectomy with bilateral      salpingo-oophorectomy.  2. Mid-urethral sling using the obturator approach.  3. Cystoscopy.   SURGEON:  Juluis Mire, M.D.   ASSISTANT:  Zelphia Cairo, M.D.   ANESTHESIA:  General endotracheal.   ESTIMATED BLOOD LOSS:  300-400 mL.   PACKS AND DRAINS:  None.   INTRAOPERATIVE BLOOD REPLACEMENT:  None.   COMPLICATIONS:  None.   INDICATIONS:  In dictated history and physical.   PROCEDURE:  The patient was taken to the OR and placed in supine  position.  After a satisfactory level of general endotracheal anesthesia  was obtained, the patient was placed in the dorsal lithotomy position  using the Allen stirrups.  The abdomen, perineum and vagina were prepped  out with Betadine.  Bladder was emptied by in-and-out catheterization.  A Hulka tenaculum was put in place and secured.  The patient was then  draped in a sterile field.  A subumbilical incision was made a knife.  The Veress needle was introduced in the abdominal cavity.  Abdomen was  insufflated with approximately 3 L of carbon dioxide.  The operating  laparoscope was then introduced.  Visualization revealed no evidence of  injury to adjacent organs.  A 5-mm trocar was put in place in the  suprapubic area under direct visualization.  Visualization  revealed the  uterus to be upper limits of normal size.  Tubes and ovaries were  unremarkable.  There is no evidence of pelvic pathology.  Appendix was  visualized and noted to be normal.  The upper abdomen including liver  were clear.  At this point in time, we brought in the Gyrus.  We  elevated the right ovary, identified the ureter in the pelvis.  We then  cauterized and incised the right ovarian vasculature.  We then  cauterized and incised the peritoneal attachments of the ovary and tube  up to the round ligament.  The round ligament was then cauterized and  incised.  We then went to the left side, elevated the left ovary and  identified the ureter well in the pelvis.  We then identified the  ovarian vasculature.  Using the Gyrus, we cauterized and incised the  ovarian vasculature.  We continued cautery and incision, separating the  ovary and tube from the peritoneal attachments up to the round ligament.  The round ligament was then cauterized and incised.  We had good  hemostasis at this  point.  The abdomen was deflated of its carbon  dioxide.  Laparoscope was removed.   The patient's legs were repositioned.  The Hulka tenaculum was then  taken out.  A weighted speculum was placed in the vaginal vault.  The  cervix was grasped with a Christella Hartigan tenaculum.  Cul-de-sac was entered  sharply.  Both uterosacral ligaments were clamped, cut and suture-  ligated with 0 Vicryl.  The reflection of the vaginal mucosa anteriorly  was incised and bladder was dissected superiorly.  Paracervical tissue  was then clamped, cut, suture-ligated with 0 Vicryl.  At this point in  time, the vesicouterine space was identified and entered and a retractor  was put in place.  Using a clamp, cut and tie technique with suture  ligatures of 0 Vicryl, the parametrium was serially separated from the  sides of the uterus.  The uterus was then flipped and the remaining  pedicles were clamped and cut.  The uterus,  tubes and ovaries were  passed off the operative field and sent to Pathology.  Held pedicles  were secured with free ties of 0 Vicryl.  Some bleeding was encountered  on the right side of the vaginal cuff; this was brought under control  with a figure-of-eight of 0 Vicryl.  Next, uterosacral plication stitch  of 0 Vicryl was put in place and secured.  Vaginal mucosa was  reapproximated in the midline with 0 Monocryl.  We had good hemostasis  at this point.  A Foley was placed to straight drain; clear urine was  obtained.   At this point in time, we decided to proceed with the mid-urethral  sling.  The suburethral area was identified and injected with Xylocaine  with epinephrine.  An incision was made in the vaginal mucosa underlying  the urethra.  Dissection was carried out laterally to the obturator  foramen on each side.  We did identify an area on the skin at the level  of the clitoris at the edge of the pubic ramus, but below the abductus  longus tendon.  This area was instilled with Xylocaine with epinephrine.  A stab incision was made.  Using the halo needles, they were passed  through the obturator foramen on both sides and out through the vaginal  opening.  We made sure there was no buttonholing of the vagina.  At this  point in time, cystoscopy was performed.  There was no evidence of  injury to the bladder or urethra.  She had been given indigo carmine.  Both ureteral orifices were identified and noted to be spilling blue-  tinged urine.  At this point in time, the polypropylene sling was  brought in and secured to the needles and brought out through the vagina  and out through the skin.  Sling was adjusted properly under the  urethra.  We could easily get a Tresa Endo and rotate it 90 degrees, but the  sling did lie flat under the mid-urethral area.  The arms of the plastic sheaths were then removed.  The arms were trimmed at the level of the  skin and they retracted nicely.   Vaginal mucosa was then closed with a  running suture of 2-0 Monocryl.  The skin incisions were closed with  Dermabond.  Foley was placed back to straight drain.   The patient's legs were repositioned.  Laparoscope was reintroduced and  abdomen insufflated with carbon dioxide.  Some oozing was noted at the  vaginal cuff and brought under control  with the Gyrus.  Otherwise, we  had excellent hemostasis at the ovarian vasculature and at the vaginal  cuff and urine output remained clear and adequate.  At this point in  time, the abdomen was deflated of its carbon dioxide and all trocars  were removed.  The subumbilical incision was closed with interrupted  subcuticulars of 4-0 Vicryl and suprapubic incision was closed with  Dermabond.  The patient was taken out of the dorsal position and once  alert and extubated, transferred to the recovery room good condition.  Sponge, instrument and needle count was reported as correct by the  circulating nurse x3.      Juluis Mire, M.D.  Electronically Signed     JSM/MEDQ  D:  08/27/2006  T:  08/27/2006  Job:  [id number]

## 2011-02-11 LAB — HM COLONOSCOPY

## 2011-02-18 ENCOUNTER — Other Ambulatory Visit: Payer: Self-pay | Admitting: Oncology

## 2011-02-18 ENCOUNTER — Encounter (HOSPITAL_BASED_OUTPATIENT_CLINIC_OR_DEPARTMENT_OTHER): Payer: BC Managed Care – PPO | Admitting: Oncology

## 2011-02-18 DIAGNOSIS — Z853 Personal history of malignant neoplasm of breast: Secondary | ICD-10-CM

## 2011-02-18 DIAGNOSIS — R51 Headache: Secondary | ICD-10-CM

## 2011-02-18 LAB — CBC WITH DIFFERENTIAL/PLATELET
BASO%: 0.5 % (ref 0.0–2.0)
Basophils Absolute: 0 10*3/uL (ref 0.0–0.1)
EOS%: 0.1 % (ref 0.0–7.0)
Eosinophils Absolute: 0 10*3/uL (ref 0.0–0.5)
HCT: 43.4 % (ref 34.8–46.6)
HGB: 14.8 g/dL (ref 11.6–15.9)
LYMPH%: 31.1 % (ref 14.0–49.7)
MCH: 28.9 pg (ref 25.1–34.0)
MCHC: 34.2 g/dL (ref 31.5–36.0)
MCV: 84.4 fL (ref 79.5–101.0)
MONO#: 0.3 10*3/uL (ref 0.1–0.9)
MONO%: 8.3 % (ref 0.0–14.0)
NEUT#: 2.4 10*3/uL (ref 1.5–6.5)
NEUT%: 60 % (ref 38.4–76.8)
Platelets: 184 10*3/uL (ref 145–400)
RBC: 5.14 10*6/uL (ref 3.70–5.45)
RDW: 12.3 % (ref 11.2–14.5)
WBC: 4 10*3/uL (ref 3.9–10.3)
lymph#: 1.3 10*3/uL (ref 0.9–3.3)

## 2011-02-18 LAB — COMPREHENSIVE METABOLIC PANEL
ALT: 15 U/L (ref 0–35)
AST: 20 U/L (ref 0–37)
Alkaline Phosphatase: 63 U/L (ref 39–117)
Creatinine, Ser: 0.76 mg/dL (ref 0.50–1.10)
Total Bilirubin: 0.4 mg/dL (ref 0.3–1.2)

## 2011-11-24 ENCOUNTER — Telehealth (INDEPENDENT_AMBULATORY_CARE_PROVIDER_SITE_OTHER): Payer: Self-pay | Admitting: General Surgery

## 2011-11-24 NOTE — Telephone Encounter (Signed)
Patient given contact information for Dr Odis Luster and Dr Kelly Splinter. She will call back if she needs a referral to either physician.

## 2011-11-24 NOTE — Telephone Encounter (Signed)
Message copied by Liliana Cline on Tue Nov 24, 2011 12:01 PM ------      Message from: Currie Paris      Created: Tue Nov 24, 2011 12:01 PM      Regarding: FW: plastic surgeon recommendation      Contact: [phone]       If you could give her Odis Luster and [facility] numbers please      ----- Message -----         From: Jacqulyn Cane         Sent: 11/24/2011   9:24 AM           To: Currie Paris, MD, Liliana Cline, CMA      Subject: plastic surgeon recommendation                           Pt had bil mastex by Dr. Jamey Ripa and had planned on seeing Dr. Benna Dunks for her reconstructive sx.  Dr. Benna Dunks now no longer accepts any insurance-cash only-for his services.  Ms. Boggus is not in a position to be able to do this and would like another recommendation.  Please call her at [phone]      Thanks      [place]

## 2011-12-10 ENCOUNTER — Telehealth: Payer: Self-pay

## 2011-12-10 NOTE — Telephone Encounter (Signed)
TOLD [name] THAT SHE NEEDS TO CALL HER PCP REGARDING HER 5 DAY HX OF TEMP ~99.0  AND ACHYNESS. PT. VERBALIZED UNDERSTANDING.

## 2011-12-16 ENCOUNTER — Telehealth: Payer: Self-pay

## 2011-12-16 NOTE — Telephone Encounter (Addendum)
MS. Voigt CALLED TO LET DR. Darrold Span KNOW THAT SHE SAW PCP.  HER LABS LOOKED OK.  SHE IS TO F/U WITH PCP IN 1 WEEK.  SHE CONTINUES WITH LOW GRADE TEMP AND LOW ENERGY. HOPEFULLY SHE IS ON THE RIGHT TRACT. TOLD MS. Sistrunk THAT DR. Darrold Span IS IN AGREEMENT WITH WHAT THE PLAN IS FOR HER CARE.

## 2012-02-17 ENCOUNTER — Telehealth: Payer: Self-pay | Admitting: Oncology

## 2012-02-17 ENCOUNTER — Encounter: Payer: Self-pay | Admitting: Oncology

## 2012-02-17 ENCOUNTER — Ambulatory Visit: Payer: BC Managed Care – PPO

## 2012-02-17 ENCOUNTER — Ambulatory Visit (HOSPITAL_BASED_OUTPATIENT_CLINIC_OR_DEPARTMENT_OTHER): Payer: BC Managed Care – PPO | Admitting: Oncology

## 2012-02-17 ENCOUNTER — Other Ambulatory Visit (HOSPITAL_BASED_OUTPATIENT_CLINIC_OR_DEPARTMENT_OTHER): Payer: BC Managed Care – PPO | Admitting: Lab

## 2012-02-17 VITALS — BP 116/87 | HR 95 | Temp 98.7°F | Ht 63.0 in | Wt 167.0 lb

## 2012-02-17 DIAGNOSIS — Z8501 Personal history of malignant neoplasm of esophagus: Secondary | ICD-10-CM

## 2012-02-17 DIAGNOSIS — Z9011 Acquired absence of right breast and nipple: Secondary | ICD-10-CM | POA: Insufficient documentation

## 2012-02-17 DIAGNOSIS — C50912 Malignant neoplasm of unspecified site of left female breast: Secondary | ICD-10-CM | POA: Insufficient documentation

## 2012-02-17 DIAGNOSIS — R51 Headache: Secondary | ICD-10-CM

## 2012-02-17 DIAGNOSIS — Z90721 Acquired absence of ovaries, unilateral: Secondary | ICD-10-CM

## 2012-02-17 DIAGNOSIS — Z9010 Acquired absence of unspecified breast and nipple: Secondary | ICD-10-CM

## 2012-02-17 DIAGNOSIS — Z1501 Genetic susceptibility to malignant neoplasm of breast: Secondary | ICD-10-CM | POA: Insufficient documentation

## 2012-02-17 DIAGNOSIS — M8580 Other specified disorders of bone density and structure, unspecified site: Secondary | ICD-10-CM

## 2012-02-17 DIAGNOSIS — Z90710 Acquired absence of both cervix and uterus: Secondary | ICD-10-CM

## 2012-02-17 DIAGNOSIS — R3129 Other microscopic hematuria: Secondary | ICD-10-CM

## 2012-02-17 LAB — COMPREHENSIVE METABOLIC PANEL
AST: 22 U/L (ref 0–37)
Albumin: 4.5 g/dL (ref 3.5–5.2)
BUN: 17 mg/dL (ref 6–23)
CO2: 25 mEq/L (ref 19–32)
Calcium: 9.4 mg/dL (ref 8.4–10.5)
Chloride: 104 mEq/L (ref 96–112)
Creatinine, Ser: 0.7 mg/dL (ref 0.50–1.10)
Glucose, Bld: 106 mg/dL — ABNORMAL HIGH (ref 70–99)
Potassium: 4.2 mEq/L (ref 3.5–5.3)

## 2012-02-17 LAB — URINALYSIS, MICROSCOPIC - CHCC
Blood: NEGATIVE
Ketones: NEGATIVE mg/dL
Nitrite: NEGATIVE
Protein: NEGATIVE mg/dL
Specific Gravity, Urine: 1.015 (ref 1.003–1.035)
pH: 6.5 (ref 4.6–8.0)

## 2012-02-17 LAB — CBC WITH DIFFERENTIAL/PLATELET
Basophils Absolute: 0 10*3/uL (ref 0.0–0.1)
Eosinophils Absolute: 0.1 10*3/uL (ref 0.0–0.5)
HCT: 41.3 % (ref 34.8–46.6)
HGB: 13.9 g/dL (ref 11.6–15.9)
LYMPH%: 30.4 % (ref 14.0–49.7)
MCHC: 33.7 g/dL (ref 31.5–36.0)
MONO#: 0.4 10*3/uL (ref 0.1–0.9)
NEUT#: 3 10*3/uL (ref 1.5–6.5)
NEUT%: 58.5 % (ref 38.4–76.8)
Platelets: 198 10*3/uL (ref 145–400)
WBC: 5 10*3/uL (ref 3.9–10.3)
lymph#: 1.5 10*3/uL (ref 0.9–3.3)

## 2012-02-17 NOTE — Telephone Encounter (Signed)
appts made and printed for pt and pt sent back to the lab   aom

## 2012-02-17 NOTE — Progress Notes (Signed)
OFFICE PROGRESS NOTE Date of Visit 02-17-12 Physicians: T.Pickard @ [employer], [place].Grewal, A.Jordan, (R.Murray), C.Streck, M.Magod, C.Hagen  INTERVAL HISTORY:  Patient is seen, alone for visit, in scheduled yearly follow up of her history of left breast cancer, BRCA 2 positive. 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 She had neoadjuvant adriamycin/cytoxan/taxotere x 3 cycles, left mastectomy with no residual tumor and 3 sentinel nodes negative, plus prophylactic right mastectomy, by Dr Jamey Ripa on 09-17-2003. She had additional 3 cycles of same chemotherapy after surgery, no RT, and a year of herceptin. She was BRCA 2 positive and had prophylactic hysterectomy with BSO in Dec 2007. She has been followed since completion of all of that treatment on observation, without known active disease. She has bilateral breast reconstructions; last imaging was MRI of breasts Jan 2009.  Patient recalls that Dr Benna Dunks mentioned possible replacement of implants at 10 years, as the implants used were fairly new at time of surgery. She does not think that Dr Benna Dunks accepts her insurance now and she is not aware of any problems from implants, but may want to discuss with another plastic surgeon re recommendations for this type of implant now with almost 10 years of additional information. I have given her the name of a plastic surgeon and she will look into her insurance coverage for that MD. Patient has been doing generally very well, tho she had apparent viral illness in May with low grade fever x 3 weeks, work up including CXR by PCP, only abnormality an elevated ESR which subsequently normalized and microscopic hematuria. She is due repeat UA which we have gotten here today, no microscopic hematuria. She feels entirely well now. She is to see Dr Vincente Poli ~ Dec and is on regular follow up with Dr Amy [place].  Last bone density scan  was at Dr Particia Jasper office, date not clear to me, reportedly with osteopenia. That information should be in Dr Lynnell Dike records and patient understands that she may be due repeat bone density when she sees Dr Vincente Poli. I have encouraged her to take calcium regularly, which she is not doing.  Review of Systems: no neuro, respiratory, cardiac, GI, urinary, hematologic, musculoskeletal, skin, endocrine symptoms. Energy is excellent. No changes in reconstructed breasts. She does not use estrace vaginal cream daily, and I have told her that I prefer her to use least amount necessary to control bothersome symptoms.  Objective:  Vital signs in last 24 hours:  BP 116/87  Pulse 95  Temp 98.7 F (37.1 C) (Oral)  Ht 5\' 3"  (1.6 m)  Wt 167 lb (75.751 kg)  BMI 29.58 kg/m2 Easily ambulatory, looks comfortable.Weight is up 12 lbs from last year.   HEENT:mucous membranes moist, pharynx normal without lesions.Normal hair. PERRL, not icteric LymphaticsCervical, supraclavicular, and axillary nodes normal.No inguinal adenopathy Resp: clear to auscultation bilaterally and normal percussion bilaterally Cardio: regular rate and rhythm GI: soft, non-tender; bowel sounds normal; no masses,  no organomegaly Extremities: extremities normal, atraumatic, no cyanosis or edema Neuro nonfocal Skin with multiple nevi face, trunk, extremities. None obviously darker than rest on skin areas seen. Bilateral reconstructed breasts soft without apparent encapsulation of the implants, nontender, nothing of concern for local recurrence or separate primary malignancy. Axillae benign. No swelling LUE.  Lab Results:   [facility] 02/17/12 0914  WBC 5.0  HGB 13.9  HCT 41.3  PLT 198  ANC 3.0 Differential not remarkable  BMET  CMET resulted after visit entirely normal other than nonfasting glucose 106 Studies/Results:  No results found.  Medications: I have reviewed the patient's current medications. See discussion of  estrace and calcium above.  Assessment/Plan: 1. History of left breast carcinoma with history as above in patient with BRCA2 abnormality: clinically doing well. I will continue to follow yearly or sooner if needed. 2.osteopenia and surgical menopause: may be due bone density scan when she sees gyn next. Oral calcium + D. 3.prophylactic right mastectomy and hysterectomy/ BSO 4.breast implants as above 5.apparent viral illness last month, resolved 6.clear UA now 7.multiple nevi, followed by dermatology 8.due colonoscopy by Dr Ewing Schlein June 2017: last with incomplete prep June 2012 9. Hx migraines, known to Dr.C.Murlean Hark, MD   02/17/2012, 10:58 AM

## 2012-02-17 NOTE — Patient Instructions (Signed)
Use least estrogen cream possible to control symptoms.

## 2012-02-18 ENCOUNTER — Telehealth: Payer: Self-pay | Admitting: Medical Oncology

## 2012-02-18 NOTE — Telephone Encounter (Signed)
Pt notified that she does not have blood in her urine specimen from 02/17/12.  I will route to HIM to send UA report to [facility]

## 2012-02-19 ENCOUNTER — Telehealth: Payer: Self-pay

## 2012-02-19 ENCOUNTER — Other Ambulatory Visit: Payer: Self-pay

## 2012-02-19 DIAGNOSIS — C50912 Malignant neoplasm of unspecified site of left female breast: Secondary | ICD-10-CM

## 2012-02-19 MED ORDER — VENLAFAXINE HCL 37.5 MG PO TABS: 37.5000 mg | ORAL_TABLET | Freq: Two times a day (BID) | ORAL | Status: DC

## 2012-02-19 NOTE — Telephone Encounter (Signed)
ENCOUNTER OPENED IN ERROR

## 2012-04-01 ENCOUNTER — Telehealth: Payer: Self-pay

## 2012-04-01 NOTE — Telephone Encounter (Signed)
Ms. Gracy Racer wanted Dr. Darrold Span  to know that she was very impressed with DR. Onalee Hua and her staff.  She would highly  recommend her to people.

## 2012-05-10 ENCOUNTER — Telehealth: Payer: Self-pay

## 2012-05-10 NOTE — Telephone Encounter (Addendum)
Kristine Ortega called stating that Dr. Jonna Munro at [facility] did an MRI of her breast implants to check their integrity and there was an incidental finding  Of a 2 cm lesion on her liver.  It looks cystic per radiologist.  Dr. Onalee Hua is going to order an abdominal MRI to evaluate the lesion.   Ms. Voth thinks that this lesion was seen on a prior study at the [facility]..  If there is no change in the lesion,  does she need the MRI of the  The abdomen? A cd of the MRI done at [facility] is being sent to Dr. Darrold Span per Ms. Baksh's request.

## 2012-05-11 NOTE — Telephone Encounter (Signed)
Spoke with Kristine Ortega and told her that a report of ct abd. From 07-05-03 was faxed to Dr. Onalee Hua noting a cyst in right dome of the liver.  [phone].  Office: [phone]. Kristine Ortega will go ahead and have MRI of abd at [facility] next week as Dr. Darrold Span is fine  with obtaining scan.  Kristine Ortega will have a copy of the report sent to Dr. Darrold Span.

## 2012-07-05 ENCOUNTER — Telehealth: Payer: Self-pay | Admitting: Oncology

## 2012-07-05 NOTE — Telephone Encounter (Signed)
s.w. pt and advised on schedule change to 06.10.14.Marland KitchenMarland KitchenMarland KitchenDoc out of the office

## 2012-07-25 ENCOUNTER — Other Ambulatory Visit: Payer: Self-pay | Admitting: Oncology

## 2012-07-25 ENCOUNTER — Telehealth: Payer: Self-pay

## 2012-07-25 DIAGNOSIS — C50912 Malignant neoplasm of unspecified site of left female breast: Secondary | ICD-10-CM

## 2012-07-25 NOTE — Telephone Encounter (Signed)
Ms. Greenlief called cc of lower back pain since mid Oct. 2013.  She does not recall straining her back.  She does not have any urinary symptoms.  She has used OTC Percogesic  for pain with little effect.   She is especially having difficulty bending.  The pain  Increases with standing and sitting for long periods of time.  It is progressively getting worse. Told Ms. Krohn that Dr. Darrold Span ordered X-rays of her back and to  see PA. Tiana Loft  on 07-29-12.  She is to call  the schedulers if she has not heard about appts. by  Tuesday afternoon 07-26-12. Patient verbalized understanding.

## 2012-07-26 ENCOUNTER — Telehealth: Payer: Self-pay | Admitting: Oncology

## 2012-07-26 NOTE — Telephone Encounter (Signed)
lmonvm adviisng the pt of her appts on 07/29/2012@10 :15am along with an x-ray of the lumbar spine as a walkin appt any day this week before friday

## 2012-07-28 ENCOUNTER — Ambulatory Visit (HOSPITAL_COMMUNITY)
Admission: RE | Admit: 2012-07-28 | Discharge: 2012-07-28 | Disposition: A | Discharge status: Home / Self Care | Payer: BC Managed Care – PPO | Source: Ambulatory Visit | Attending: Oncology | Admitting: Oncology

## 2012-07-28 DIAGNOSIS — M5137 Other intervertebral disc degeneration, lumbosacral region: Secondary | ICD-10-CM | POA: Insufficient documentation

## 2012-07-28 DIAGNOSIS — C50912 Malignant neoplasm of unspecified site of left female breast: Secondary | ICD-10-CM

## 2012-07-28 DIAGNOSIS — M545 Low back pain, unspecified: Secondary | ICD-10-CM | POA: Insufficient documentation

## 2012-07-28 DIAGNOSIS — C50919 Malignant neoplasm of unspecified site of unspecified female breast: Secondary | ICD-10-CM | POA: Insufficient documentation

## 2012-07-28 DIAGNOSIS — M51379 Other intervertebral disc degeneration, lumbosacral region without mention of lumbar back pain or lower extremity pain: Secondary | ICD-10-CM | POA: Insufficient documentation

## 2012-07-29 ENCOUNTER — Telehealth: Payer: Self-pay | Admitting: Oncology

## 2012-07-29 ENCOUNTER — Other Ambulatory Visit (HOSPITAL_BASED_OUTPATIENT_CLINIC_OR_DEPARTMENT_OTHER): Payer: BC Managed Care – PPO | Admitting: Lab

## 2012-07-29 ENCOUNTER — Ambulatory Visit (HOSPITAL_BASED_OUTPATIENT_CLINIC_OR_DEPARTMENT_OTHER): Payer: BC Managed Care – PPO | Admitting: Physician Assistant

## 2012-07-29 VITALS — BP 135/94 | HR 89 | Temp 98.9°F | Resp 18 | Ht 63.0 in | Wt 170.8 lb

## 2012-07-29 DIAGNOSIS — M549 Dorsalgia, unspecified: Secondary | ICD-10-CM

## 2012-07-29 DIAGNOSIS — Z853 Personal history of malignant neoplasm of breast: Secondary | ICD-10-CM

## 2012-07-29 DIAGNOSIS — C50919 Malignant neoplasm of unspecified site of unspecified female breast: Secondary | ICD-10-CM

## 2012-07-29 DIAGNOSIS — C50912 Malignant neoplasm of unspecified site of left female breast: Secondary | ICD-10-CM

## 2012-07-29 DIAGNOSIS — M949 Disorder of cartilage, unspecified: Secondary | ICD-10-CM

## 2012-07-29 LAB — COMPREHENSIVE METABOLIC PANEL (CC13)
AST: 21 U/L (ref 5–34)
Alkaline Phosphatase: 76 U/L (ref 40–150)
BUN: 17 mg/dL (ref 7.0–26.0)
Calcium: 10.5 mg/dL — ABNORMAL HIGH (ref 8.4–10.4)
Creatinine: 0.8 mg/dL (ref 0.6–1.1)
Total Bilirubin: 0.39 mg/dL (ref 0.20–1.20)

## 2012-07-29 LAB — CBC WITH DIFFERENTIAL/PLATELET
Basophils Absolute: 0 10*3/uL (ref 0.0–0.1)
EOS%: 1 % (ref 0.0–7.0)
HCT: 41.5 % (ref 34.8–46.6)
HGB: 14.4 g/dL (ref 11.6–15.9)
LYMPH%: 32.7 % (ref 14.0–49.7)
MCH: 29 pg (ref 25.1–34.0)
MCV: 83.9 fL (ref 79.5–101.0)
MONO%: 7.2 % (ref 0.0–14.0)
NEUT%: 58.2 % (ref 38.4–76.8)

## 2012-07-29 MED ORDER — CYCLOBENZAPRINE HCL 10 MG PO TABS: ORAL_TABLET | ORAL | Status: DC

## 2012-07-29 NOTE — Telephone Encounter (Signed)
Per pof pt needed a referral to [facility]...scheduled appt with Olegario Messier for [facility] Nov 27th @ 9:30am with P.A. Hoyt...i emailed Tiana Loft to add referral and she responsed "  Could not find option to do so within [employer]. Will happily do so if someone can help me find it!  ".....also schedule June 2014 appt....printed and gv pt appt schedule for Nov 2013 and June 2014...the patient aware

## 2012-07-29 NOTE — Patient Instructions (Addendum)
Take ibuprofen 600 mg by mouth with food every 8 hours You may take the Flexeril as prescribed as needed for muscle spasms We are referring you to an Orthopedist for further evaluation of your back pain Follow up with Dr. Darrold Span as previously scheduled

## 2012-07-29 NOTE — Telephone Encounter (Signed)
Faxed orders to [facility] [phone].Marland KitchenMarland KitchenMarland Kitchen

## 2012-08-05 NOTE — Progress Notes (Signed)
OFFICE PROGRESS NOTE Date of Visit 07/29/2012 Physicians: T.Pickard @ [employer], [place].Grewal, A.Jordan, (R.Murray), C.Streck, M.Magod, C.Hagen  INTERVAL HISTORY:  Patient is seen, alone for a work in visit complaining of back pain. She is followed here for her history of left breast cancer, BRCA 2 positive. 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 She had neoadjuvant adriamycin/cytoxan/taxotere x 3 cycles, left mastectomy with no residual tumor and 3 sentinel nodes negative, plus prophylactic right mastectomy, by Dr Jamey Ripa on 09-17-2003. She had additional 3 cycles of same chemotherapy after surgery, no RT, and a year of herceptin. She was BRCA 2 positive and had prophylactic hysterectomy with BSO in Dec 2007. She has been followed since completion of all of that treatment on observation, without known active disease. She has bilateral breast reconstructions; last imaging was MRI of breasts Jan 2009.  Patient recalls that Dr Benna Dunks mentioned possible replacement of implants at 10 years, as the implants used were fairly new at time of surgery. She does not think that Dr Benna Dunks accepts her insurance now and she is not aware of any problems from implants, but may want to discuss with another plastic surgeon re recommendations for this type of implant now with almost 10 years of additional information. I have given her the name of a plastic surgeon and she will look into her insurance coverage for that MD. Patient has been doing generally very well, She is to see Dr Vincente Poli ~ Dec and is on regular follow up with Dr Amy Swaziland.  Patient presents complaining of back pain that is worse when she is standing or sitting for long period of time. The discomfort has been present for a few weeks. She denies any frank numbness 13 wing in the lower extremities although does describe a "hot feeling" intermittently on the bottom of  her right foot. The pain is primarily across her lower back and down the back of her thighs. The pain does not wake her and she is able to function particularly if she wears flat shoes as opposed to heals. She tried some intermittent doses of ibuprofen but nothing consistent.  Review of Systems: She denied any specific neurologic symptomatology, no respiratory or cardiac symptomatology, denied nausea or vomiting, diarrhea or constipation. Denied any urinary  hematologic, musculoskeletal, skin, endocrine symptoms.  Objective:  Vital signs in last 24 hours:  BP 135/94  Pulse 89  Temp 98.9 F (37.2 C) (Oral)  Resp 18  Ht 5\' 3"  (1.6 m)  Wt 170 lb 12.8 oz (77.474 kg)  BMI 30.26 kg/m2 Easily ambulatory, looks comfortable.Weight is up 12 lbs from last year.   HEENT:mucous membranes moist, pharynx normal without lesions.Normal hair. PERRL, not icteric LymphaticsCervical, supraclavicular, and axillary nodes normal.No inguinal adenopathy Resp: clear to auscultation bilaterally and normal percussion bilaterally Cardio: regular rate and rhythm GI: soft, non-tender; bowel sounds normal; no masses,  no organomegaly Extremities: extremities normal, atraumatic, no cyanosis or edema Neuro nonfocal, returned reflexes 2+ and equal. Negative straight leg raise Skin with multiple nevi face, trunk, extremities. No noted changes in color or shape  to any lesions on the skin areas seen.   Lab Results:  No results found for this basename: WBC:2,HGB:2,HCT:2,PLT:2 in the last 72 hoursANC 3.0 Differential not remarkable  BMET CMET resulted after visit entirely normal other than nonfasting glucose 108 and a calcium of 10.5  Studies/Results: Dg Lumbar Spine Complete  07/28/2012  *RADIOLOGY  REPORT*  Clinical Data: Low back pain radiating into the left leg.  History of breast cancer.  LUMBAR SPINE - COMPLETE 4+ VIEW  Comparison: No priors.  Findings: Multiple views of the lumbar spine demonstrate no acute  displaced fracture or compression type fracture.  No definite aggressive appearing lytic or blastic lesion is noted in the lumbar spine.  Alignment is anatomic.  No defects of the pars interarticularis are appreciated.  Mild multilevel degenerative disc disease is noted, most severe at L4-L5 where there is approximate 4 mm of retrolisthesis of L4 upon L5. Mild multilevel facet arthropathy, most severe at L5-S1.  IMPRESSION: 1.  No acute radiographic abnormality of the lumbar spine. 2.  Mild multilevel degenerative disc disease and lumbar spondylosis, most severe at L4-L5 where there is 4 mm of retrolisthesis of L4 upon L5.   Original Report Authenticated By: Trudie Reed, M.D.    No results found.  Medications: I have reviewed the patient's current medications. The patient is to take ibuprofen 600 mg by mouth daily every 8 hours with food as well as Flexeril 10 mg a half tablet to one tablet by mouth every 8 hours as needed for muscle spasm.  Assessment/Plan: 1. History of left breast carcinoma with history as above in patient with BRCA2 abnormality: clinically doing well. I will continue to follow yearly or sooner if needed. 2.osteopenia and surgical menopause: may be due bone density scan when she sees gyn next. Oral calcium + D. 3.prophylactic right mastectomy and hysterectomy/ BSO 4.breast implants as above 5.multiple nevi, followed by dermatology 6.due colonoscopy by Dr Ewing Schlein June 2017: last with incomplete prep June 2012 7.Hx migraines, known to Dr.C.Hagen 8. Back pain likely related to the multilevel degenerative disc disease and lumbar spondylosis revealed on her lumbar spine films today. The patient was discussed with Dr. Darrold Span. We'll have her take ibuprofen on a more regular basis to help with a suspected inflammatory component of her pain. She will also be given a prescription for Flexeril to be taken at 10 mg, half a tablet to one tablet by mouth every 8 hours as needed for muscle  spasm. We'll refer her to [facility] for further evaluation and management of her low back pain. She is to followup with Dr. Darrold Span as previously scheduled.  Laural Benes, Mikelle Myrick E,PA-C   08/05/2012, 4:10 PM

## 2012-09-15 ENCOUNTER — Other Ambulatory Visit: Payer: Self-pay | Admitting: Obstetrics and Gynecology

## 2013-02-13 ENCOUNTER — Other Ambulatory Visit: Payer: BC Managed Care – PPO | Admitting: Lab

## 2013-02-13 ENCOUNTER — Ambulatory Visit: Payer: BC Managed Care – PPO | Admitting: Oncology

## 2013-02-13 ENCOUNTER — Ambulatory Visit (INDEPENDENT_AMBULATORY_CARE_PROVIDER_SITE_OTHER): Payer: BC Managed Care – PPO | Admitting: Family Medicine

## 2013-02-13 DIAGNOSIS — Z23 Encounter for immunization: Secondary | ICD-10-CM

## 2013-02-13 NOTE — Progress Notes (Signed)
Patient ID: Kristine Ortega, female   DOB: [birth year], 47 y.o.   MRN: [id number] Tdap given

## 2013-02-14 ENCOUNTER — Telehealth: Payer: Self-pay | Admitting: Oncology

## 2013-02-14 ENCOUNTER — Other Ambulatory Visit (HOSPITAL_BASED_OUTPATIENT_CLINIC_OR_DEPARTMENT_OTHER): Payer: BC Managed Care – PPO | Admitting: Lab

## 2013-02-14 ENCOUNTER — Ambulatory Visit (HOSPITAL_BASED_OUTPATIENT_CLINIC_OR_DEPARTMENT_OTHER): Payer: BC Managed Care – PPO | Admitting: Oncology

## 2013-02-14 ENCOUNTER — Encounter: Payer: Self-pay | Admitting: Oncology

## 2013-02-14 VITALS — BP 136/96 | HR 74 | Temp 98.8°F | Resp 18 | Ht 63.0 in | Wt 175.5 lb

## 2013-02-14 DIAGNOSIS — C50912 Malignant neoplasm of unspecified site of left female breast: Secondary | ICD-10-CM

## 2013-02-14 DIAGNOSIS — C50919 Malignant neoplasm of unspecified site of unspecified female breast: Secondary | ICD-10-CM

## 2013-02-14 LAB — COMPREHENSIVE METABOLIC PANEL (CC13)
ALT: 26 U/L (ref 0–55)
AST: 22 U/L (ref 5–34)
Albumin: 4 g/dL (ref 3.5–5.0)
Alkaline Phosphatase: 70 U/L (ref 40–150)
BUN: 12.9 mg/dL (ref 7.0–26.0)
CO2: 22 meq/L (ref 22–29)
Calcium: 9.4 mg/dL (ref 8.4–10.4)
Chloride: 109 meq/L — ABNORMAL HIGH (ref 98–107)
Creatinine: 0.8 mg/dL (ref 0.6–1.1)
Glucose: 95 mg/dL (ref 70–99)
Potassium: 4.1 meq/L (ref 3.5–5.1)
Sodium: 143 meq/L (ref 136–145)
Total Bilirubin: 0.43 mg/dL (ref 0.20–1.20)
Total Protein: 7.3 g/dL (ref 6.4–8.3)

## 2013-02-14 LAB — CBC WITH DIFFERENTIAL/PLATELET
Basophils Absolute: 0.1 10*3/uL (ref 0.0–0.1)
EOS%: 1.9 % (ref 0.0–7.0)
HCT: 41.7 % (ref 34.8–46.6)
HGB: 14.4 g/dL (ref 11.6–15.9)
MCH: 28.5 pg (ref 25.1–34.0)
NEUT%: 66 % (ref 38.4–76.8)
Platelets: 204 10*3/uL (ref 145–400)
lymph#: 1.5 10*3/uL (ref 0.9–3.3)

## 2013-02-14 NOTE — Progress Notes (Signed)
OFFICE PROGRESS NOTE   02/14/2013   Physicians:T.Pickard [facility], PCP), Judie Petit.Grewal, A.Jordan, M.Magod, (R.Murray, C.Streck, C.Sharene Skeans)  INTERVAL HISTORY:   Patient is seen, alone for visit, in scheduled yearly follow up of history of left breast cancer, BRCA 2 positive. She is post bilateral mastectomies and hysterectomy/ BSO. She continues to do well, with no complaints at all today.  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 She had neoadjuvant adriamycin/cytoxan/taxotere x 3 cycles, left mastectomy with no residual tumor and 3 sentinel nodes negative, plus prophylactic right mastectomy, by Dr Jamey Ripa on 09-17-2003. She had additional 3 cycles of same chemotherapy after surgery, no RT, and a year of herceptin. She was BRCA 2 positive and had prophylactic hysterectomy with BSO in Dec 2007. She has been followed since completion of all of that treatment on observation, without known active disease.  She has bilateral breast reconstructions; last imaging was MRI of breasts Jan 2009.   No recent illness, good energy and activity, no respiratory/ cardiac/ GI/urinary/ musculoskeletal or skin concerns. The bilateral breast reconstructions are not uncomfortable at all; she was seen back by Dr Benna Dunks for consultation, and he felt that nothing needs to be done from standpoint of the implants probably for another few years. She had repeat bone density by Dr Vincente Poli in Jan 2014, reportedly stable (I believe previous low bone density, however I do not have that report). PAP by Dr Vincente Poli Jan 2014 negative. Remainder of 10 point Review of Systems negative.  Gila would like to try tapering off the Effexor this fall, this bid for hot flashes. I have suggested trying once daily initially, then PCP Dr Tanya Nones can help with taper.  She sees PCP just prn, Dr Vincente Poli yearly for gyn, Dr Amy Swaziland yearly for dermatology. Next  colonoscopy by Dr Ewing Schlein is due ~ 2017. She is just prn with Dr Chipper Herb and Dr Jamey Ripa; Dr Sharene Skeans is no longer at [facility] and PCP will follow the migraine treatment now. She is expecting her first grandchild in Nov. Her 2 younger sons have both graduated, one to join [employer].  Objective:  Vital signs in last 24 hours:  BP 136/96  Pulse 74  Temp(Src) 98.8 F (37.1 C) (Oral)  Resp 18  Ht 5\' 3"  (1.6 m)  Wt 175 lb 8 oz (79.606 kg)  BMI 31.1 kg/m2 Weight is up from 167 in 2013. Easily ambulatory, looks comfortable, just delightful as always.   HEENT:PERRLA, sclera clear, anicteric and oropharynx clear, no lesions LymphaticsCervical, supraclavicular, and axillary nodes normal. Resp: clear to auscultation bilaterally and normal percussion bilaterally Cardio: regular rate and rhythm GI: soft, non-tender; bowel sounds normal; no masses,  no organomegaly Extremities: extremities normal, atraumatic, no cyanosis or edema Neuro:nonfocal Breasts: bilateral reconstructions without evidence of local recurrence on left and no findings of concern on right. Nothing in axillae Skin with multiple scattered nevi trunk and extremities, none that are obviously of concern just to my exam.  Lab Results:  Results for orders placed in visit on 02/14/13  CBC WITH DIFFERENTIAL      Result Value Range   WBC 6.3  3.9 - 10.3 10e3/uL   NEUT# 4.2  1.5 - 6.5 10e3/uL   HGB 14.4  11.6 - 15.9 g/dL   HCT 40.9  81.1 - 91.4 %   Platelets 204  145 - 400 10e3/uL   MCV 82.5  79.5 - 101.0 fL  MCH 28.5  25.1 - 34.0 pg   MCHC 34.5  31.5 - 36.0 g/dL   RBC 6.64  4.03 - 4.74 10e6/uL   RDW 13.5  11.2 - 14.5 %   lymph# 1.5  0.9 - 3.3 10e3/uL   MONO# 0.4  0.1 - 0.9 10e3/uL   Eosinophils Absolute 0.1  0.0 - 0.5 10e3/uL   Basophils Absolute 0.1  0.0 - 0.1 10e3/uL   NEUT% 66.0  38.4 - 76.8 %   LYMPH% 24.2  14.0 - 49.7 %   MONO% 7.0  0.0 - 14.0 %   EOS% 1.9  0.0 - 7.0 %   BASO% 0.9  0.0 - 2.0 %   CMET  available after visit normal with exception on chloride 109, including electrolytes otherwise, creatinine 0.8, normal LFTs, calcium 9.4 with alb 4.0  Studies/Results: Last imaging in this EMR was LS x rays 07-2012  Medications: I have reviewed the patient's current medications.  We have discussed follow up at this office, now out almost 10 years from diagnosis, BRCA2 positive, treatment as above. She prefers to continue yearly follow up thru this office, which is reasonable with the BRCA 2 mutation particularly. She will continue regular follow up with gyn and dermatology.  Assessment/Plan: 1. History of left breast carcinoma with history as above in patient with BRCA2 abnormality: clinically doing well.  continue to follow yearly or sooner if needed.  2.osteopenia and surgical menopause: had bone density scan by Dr Vincente Poli  Oral calcium + D.  3.prophylactic right mastectomy and hysterectomy/ BSO  4.breast implants as above  5.progressive weight gain 6.multiple nevi, followed by dermatology  7.due colonoscopy by Dr Ewing Schlein June 2017 8. Hx migraines well controlled with prn imitrex  Patient is in agreement with plan above and has had questions answered to her satisfaction   Reece Packer, MD   02/14/2013, 9:53 AM

## 2013-02-14 NOTE — Telephone Encounter (Signed)
gv and pritned aptp sched and avs for pt 2015

## 2013-02-14 NOTE — Patient Instructions (Signed)
Continue yearly gyn exams and dermatology exams  Dr Tanya Nones would be correct to help you taper off effexor this fall

## 2013-05-09 ENCOUNTER — Other Ambulatory Visit: Payer: Self-pay | Admitting: Orthopedic Surgery

## 2013-05-11 ENCOUNTER — Encounter (HOSPITAL_BASED_OUTPATIENT_CLINIC_OR_DEPARTMENT_OTHER): Payer: Self-pay | Admitting: *Deleted

## 2013-05-11 NOTE — Progress Notes (Signed)
No labs needed

## 2013-05-15 NOTE — H&P (Signed)
  Kristine Ortega is an 47 y.o. female.   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 She has a history of numbness and tingling in her hands, right worse than left. She has numbness every night. She relieves her dysesthesias by hanging her arms down. To date she has had no discomfort in her neck, no history of neck injury, and no history of hand or wrist injury. She has no history of diabetes, thyroid disease or gout. Her family history is positive for her mother having carpal tunnel syndrome in the middle years of life. She has had no injections or electrodiagnostic studies to date.    Past Medical History  Diagnosis Date  . Depression   . Cancer     breast cancer  . ZOXWRUEA(540.9)     Past Surgical History  Procedure Laterality Date  . Tonsillectomy    . Abdominal hysterectomy  2007    BSO  . Mastectomy w/ sentinel node biopsy  2005    left-  . Simple mastectomy  2005    right-prophalactic  . Breast reconstruction  2005    implants-bilat  . Port-a-cath removal  2007    insertion then out  . Cesarean section      x4    History reviewed. No pertinent family history. Social History:  reports that she has never smoked. She has never used smokeless tobacco. She reports that she does not drink alcohol or use illicit drugs.  Allergies: No Known Allergies  No prescriptions prior to admission    No results found for this or any previous visit (from the past 48 hour(s)).  No results found.   Pertinent items are noted in HPI.  Height 5\' 3"  (1.6 m), weight 79.379 kg (175 lb).  General appearance: alert Head: Normocephalic, without obvious abnormality Neck: supple, symmetrical, trachea midline Resp: clear to auscultation bilaterally Cardio: regular rate and rhythm GI: normal findings: bowel sounds normal Extremities:   Inspection of her hands reveals no thenar atrophy. Her sweat patterns and dermatoglyphics are preserved. She has full ROM of her fingers in flexion/extension. She has all the historical features of carpal tunnel syndrome, right worse than left. Her provocative tests are positive. NCV testing revealed prolongation of the motor latency, sensory latency, and diminutive sensory amplitudes.    Pulses: 2+ and symmetric Skin: mobility and turgor normal Neurologic: Grossly normal    Assessment/Plan Impression:Right CTS  Plan: To the OR for right CTR.The procedure, risks,benefits and post-op course were discussed with the patient at length and they were in agreement with the plan.  DASNOIT,Kristine Ortega 05/15/2013, 4:22 PM   H&P documentation: 05/16/2013  -History and Physical Reviewed  -Patient has been re-examined  -No change in the plan of care  Wyn Forster, MD

## 2013-05-16 ENCOUNTER — Ambulatory Visit (HOSPITAL_BASED_OUTPATIENT_CLINIC_OR_DEPARTMENT_OTHER): Payer: BC Managed Care – PPO | Admitting: Certified Registered Nurse Anesthetist

## 2013-05-16 ENCOUNTER — Ambulatory Visit (HOSPITAL_BASED_OUTPATIENT_CLINIC_OR_DEPARTMENT_OTHER)
Admission: RE | Admit: 2013-05-16 | Discharge: 2013-05-16 | Disposition: A | Discharge status: Home / Self Care | Payer: BC Managed Care – PPO | Source: Ambulatory Visit | Attending: Orthopedic Surgery | Admitting: Orthopedic Surgery

## 2013-05-16 ENCOUNTER — Encounter (HOSPITAL_BASED_OUTPATIENT_CLINIC_OR_DEPARTMENT_OTHER)
Admission: RE | Disposition: A | Discharge status: Home / Self Care | Payer: Self-pay | Source: Ambulatory Visit | Attending: Orthopedic Surgery

## 2013-05-16 ENCOUNTER — Encounter (HOSPITAL_BASED_OUTPATIENT_CLINIC_OR_DEPARTMENT_OTHER): Payer: Self-pay | Admitting: Certified Registered Nurse Anesthetist

## 2013-05-16 ENCOUNTER — Encounter (HOSPITAL_BASED_OUTPATIENT_CLINIC_OR_DEPARTMENT_OTHER): Payer: Self-pay | Admitting: *Deleted

## 2013-05-16 DIAGNOSIS — Z9010 Acquired absence of unspecified breast and nipple: Secondary | ICD-10-CM | POA: Insufficient documentation

## 2013-05-16 DIAGNOSIS — G5600 Carpal tunnel syndrome, unspecified upper limb: Secondary | ICD-10-CM | POA: Insufficient documentation

## 2013-05-16 DIAGNOSIS — Z90710 Acquired absence of both cervix and uterus: Secondary | ICD-10-CM | POA: Insufficient documentation

## 2013-05-16 DIAGNOSIS — Z853 Personal history of malignant neoplasm of breast: Secondary | ICD-10-CM | POA: Insufficient documentation

## 2013-05-16 DIAGNOSIS — F329 Major depressive disorder, single episode, unspecified: Secondary | ICD-10-CM | POA: Insufficient documentation

## 2013-05-16 DIAGNOSIS — F3289 Other specified depressive episodes: Secondary | ICD-10-CM | POA: Insufficient documentation

## 2013-05-16 HISTORY — PX: CARPAL TUNNEL RELEASE: SHX101

## 2013-05-16 HISTORY — DX: Headache: R51

## 2013-05-16 HISTORY — DX: Malignant (primary) neoplasm, unspecified: C80.1

## 2013-05-16 HISTORY — DX: Major depressive disorder, single episode, unspecified: F32.9

## 2013-05-16 HISTORY — DX: Depression, unspecified: F32.A

## 2013-05-16 SURGERY — CARPAL TUNNEL RELEASE
Anesthesia: General | Site: Hand | Laterality: Right | Wound class: Clean

## 2013-05-16 MED ORDER — FENTANYL CITRATE 0.05 MG/ML IJ SOLN
INTRAMUSCULAR | Status: DC | PRN
  Administered 2013-05-16: 25 ug via INTRAVENOUS
  Administered 2013-05-16: 50 ug via INTRAVENOUS
  Administered 2013-05-16: 25 ug via INTRAVENOUS

## 2013-05-16 MED ORDER — ONDANSETRON HCL 4 MG/2ML IJ SOLN
INTRAMUSCULAR | Status: DC | PRN
  Administered 2013-05-16: 4 mg via INTRAVENOUS

## 2013-05-16 MED ORDER — MIDAZOLAM HCL 2 MG/2ML IJ SOLN: 1.0000 mg | INTRAMUSCULAR | Status: DC | PRN

## 2013-05-16 MED ORDER — OXYCODONE-ACETAMINOPHEN 5-325 MG PO TABS: ORAL_TABLET | ORAL | Status: DC

## 2013-05-16 MED ORDER — OXYCODONE HCL 5 MG/5ML PO SOLN: 5.0000 mg | Freq: Once | ORAL | Status: AC | PRN

## 2013-05-16 MED ORDER — ONDANSETRON HCL 4 MG/2ML IJ SOLN: 4.0000 mg | Freq: Once | INTRAMUSCULAR | Status: DC | PRN

## 2013-05-16 MED ORDER — LIDOCAINE HCL (CARDIAC) 20 MG/ML IV SOLN
INTRAVENOUS | Status: DC | PRN
  Administered 2013-05-16: 60 mg via INTRAVENOUS

## 2013-05-16 MED ORDER — LACTATED RINGERS IV SOLN
INTRAVENOUS | Status: DC
  Administered 2013-05-16 (×2): via INTRAVENOUS

## 2013-05-16 MED ORDER — CHLORHEXIDINE GLUCONATE 4 % EX LIQD: 60.0000 mL | Freq: Once | CUTANEOUS | Status: DC

## 2013-05-16 MED ORDER — FENTANYL CITRATE 0.05 MG/ML IJ SOLN: 50.0000 ug | INTRAMUSCULAR | Status: DC | PRN

## 2013-05-16 MED ORDER — MEPERIDINE HCL 25 MG/ML IJ SOLN: 6.2500 mg | INTRAMUSCULAR | Status: DC | PRN

## 2013-05-16 MED ORDER — HYDROMORPHONE HCL PF 1 MG/ML IJ SOLN: 0.2500 mg | INTRAMUSCULAR | Status: DC | PRN

## 2013-05-16 MED ORDER — DEXAMETHASONE SODIUM PHOSPHATE 10 MG/ML IJ SOLN
INTRAMUSCULAR | Status: DC | PRN
  Administered 2013-05-16: 10 mg via INTRAVENOUS

## 2013-05-16 MED ORDER — MIDAZOLAM HCL 5 MG/5ML IJ SOLN
INTRAMUSCULAR | Status: DC | PRN
  Administered 2013-05-16: 2 mg via INTRAVENOUS

## 2013-05-16 MED ORDER — OXYCODONE HCL 5 MG PO TABS
5.0000 mg | ORAL_TABLET | Freq: Once | ORAL | Status: AC | PRN
  Administered 2013-05-16: 5 mg via ORAL

## 2013-05-16 MED ORDER — PROPOFOL 10 MG/ML IV BOLUS
INTRAVENOUS | Status: DC | PRN
  Administered 2013-05-16: 150 mg via INTRAVENOUS

## 2013-05-16 MED ORDER — LIDOCAINE HCL 2 % IJ SOLN
INTRAMUSCULAR | Status: DC | PRN
  Administered 2013-05-16: 4.5 mL

## 2013-05-16 SURGICAL SUPPLY — 38 items
BANDAGE ADHESIVE 1X3 (GAUZE/BANDAGES/DRESSINGS) IMPLANT
BANDAGE ELASTIC 3 VELCRO ST LF (GAUZE/BANDAGES/DRESSINGS) ×2 IMPLANT
BLADE SURG 15 STRL LF DISP TIS (BLADE) ×1 IMPLANT
BLADE SURG 15 STRL SS (BLADE) ×1
BNDG ESMARK 4X9 LF (GAUZE/BANDAGES/DRESSINGS) ×2 IMPLANT
BRUSH SCRUB EZ PLAIN DRY (MISCELLANEOUS) ×2 IMPLANT
CLOTH BEACON ORANGE TIMEOUT ST (SAFETY) ×2 IMPLANT
CORDS BIPOLAR (ELECTRODE) ×2 IMPLANT
COVER MAYO STAND STRL (DRAPES) ×2 IMPLANT
COVER TABLE BACK 60X90 (DRAPES) ×2 IMPLANT
CUFF TOURNIQUET SINGLE 18IN (TOURNIQUET CUFF) ×2 IMPLANT
DECANTER SPIKE VIAL GLASS SM (MISCELLANEOUS) IMPLANT
DRAPE EXTREMITY T 121X128X90 (DRAPE) ×2 IMPLANT
DRAPE SURG 17X23 STRL (DRAPES) ×2 IMPLANT
GLOVE BIO SURGEON STRL SZ 6.5 (GLOVE) ×2 IMPLANT
GLOVE BIOGEL M STRL SZ7.5 (GLOVE) ×2 IMPLANT
GLOVE BIOGEL PI IND STRL 7.0 (GLOVE) ×1 IMPLANT
GLOVE BIOGEL PI INDICATOR 7.0 (GLOVE) ×1
GLOVE ORTHO TXT STRL SZ7.5 (GLOVE) ×2 IMPLANT
GOWN BRE IMP PREV XXLGXLNG (GOWN DISPOSABLE) ×4 IMPLANT
GOWN PREVENTION PLUS XLARGE (GOWN DISPOSABLE) ×2 IMPLANT
NEEDLE 27GAX1X1/2 (NEEDLE) IMPLANT
PACK BASIN DAY SURGERY FS (CUSTOM PROCEDURE TRAY) ×2 IMPLANT
PAD CAST 3X4 CTTN HI CHSV (CAST SUPPLIES) ×1 IMPLANT
PADDING CAST ABS 4INX4YD NS (CAST SUPPLIES) ×1
PADDING CAST ABS COTTON 4X4 ST (CAST SUPPLIES) ×1 IMPLANT
PADDING CAST COTTON 3X4 STRL (CAST SUPPLIES) ×1
SPLINT PLASTER CAST XFAST 3X15 (CAST SUPPLIES) ×5 IMPLANT
SPLINT PLASTER XTRA FASTSET 3X (CAST SUPPLIES) ×5
SPONGE GAUZE 4X4 12PLY (GAUZE/BANDAGES/DRESSINGS) ×2 IMPLANT
STOCKINETTE 4X48 STRL (DRAPES) ×2 IMPLANT
STRIP CLOSURE SKIN 1/2X4 (GAUZE/BANDAGES/DRESSINGS) ×2 IMPLANT
SUT PROLENE 3 0 PS 2 (SUTURE) ×2 IMPLANT
SYR 3ML 23GX1 SAFETY (SYRINGE) IMPLANT
SYR CONTROL 10ML LL (SYRINGE) ×2 IMPLANT
TOWEL OR 17X24 6PK STRL BLUE (TOWEL DISPOSABLE) ×2 IMPLANT
TRAY DSU PREP LF (CUSTOM PROCEDURE TRAY) ×2 IMPLANT
UNDERPAD 30X30 INCONTINENT (UNDERPADS AND DIAPERS) ×2 IMPLANT

## 2013-05-16 NOTE — Anesthesia Postprocedure Evaluation (Signed)
Anesthesia Post Note  Patient: Kristine Ortega  Procedure(s) Performed: Procedure(s) (LRB): CARPAL TUNNEL RELEASE RIGHT  (Right)  Anesthesia type: general  Patient location: PACU  Post pain: Pain level controlled  Post assessment: Patient's Cardiovascular Status Stable  Last Vitals:  Filed Vitals:   05/16/13 1105  BP: 131/88  Pulse: 58  Temp: 36.6 C  Resp: 16    Post vital signs: Reviewed and stable  Level of consciousness: sedated  Complications: No apparent anesthesia complications

## 2013-05-16 NOTE — Op Note (Signed)
[id number] 

## 2013-05-16 NOTE — Anesthesia Procedure Notes (Signed)
Procedure Name: LMA Insertion Date/Time: 05/16/2013 9:09 AM Performed by: Loleta Frommelt D Pre-anesthesia Checklist: Patient identified, Emergency Drugs available, Suction available and Patient being monitored Patient Re-evaluated:Patient Re-evaluated prior to inductionOxygen Delivery Method: Circle System Utilized Preoxygenation: Pre-oxygenation with 100% oxygen Intubation Type: IV induction Ventilation: Mask ventilation without difficulty LMA: LMA inserted LMA Size: 4.0 Number of attempts: 1 Airway Equipment and Method: bite block Placement Confirmation: positive ETCO2 Tube secured with: Tape Dental Injury: Teeth and Oropharynx as per pre-operative assessment

## 2013-05-16 NOTE — Brief Op Note (Signed)
05/16/2013  9:36 AM  PATIENT:  Kristine Ortega  47 y.o. female  PRE-OPERATIVE DIAGNOSIS:  BILATERAL CARPAL TUNNEL SYNDROME  POST-OPERATIVE DIAGNOSIS:  BILATERAL CARPAL TUNNEL SYNDROME  PROCEDURE:  Procedure(s): CARPAL TUNNEL RELEASE RIGHT  (Right)  SURGEON:  Surgeon(s) and Role:    * Wyn Forster., MD - Primary  PHYSICIAN ASSISTANT:   ASSISTANTS: Mallory Shirk.A-C   ANESTHESIA:   general  EBL:  Total I/O In: 1000 [I.V.:1000] Out: -   BLOOD ADMINISTERED:none  DRAINS: none   LOCAL MEDICATIONS USED:  LIDOCAINE   SPECIMEN:  No Specimen  DISPOSITION OF SPECIMEN:  N/A  COUNTS:  YES  TOURNIQUET:  * Missing tourniquet times found for documented tourniquets in log:  [id number] *  DICTATION: .Other Dictation: Dictation Number [id number]  PLAN OF CARE: Discharge to home after PACU  PATIENT DISPOSITION:  PACU - hemodynamically stable.   Delay start of Pharmacological VTE agent (>24hrs) due to surgical blood loss or risk of bleeding: not applicable

## 2013-05-16 NOTE — Transfer of Care (Signed)
Immediate Anesthesia Transfer of Care Note  Patient: Kristine Ortega  Procedure(s) Performed: Procedure(s): CARPAL TUNNEL RELEASE RIGHT  (Right)  Patient Location: PACU  Anesthesia Type:General  Level of Consciousness: awake, sedated and responds to stimulation  Airway & Oxygen Therapy: Patient Spontanous Breathing and Patient connected to face mask oxygen  Post-op Assessment: Report given to PACU RN, Post -op Vital signs reviewed and stable and Patient moving all extremities  Post vital signs: Reviewed and stable  Complications: No apparent anesthesia complications

## 2013-05-16 NOTE — Anesthesia Preprocedure Evaluation (Addendum)
Anesthesia Evaluation  Patient identified by MRN, date of birth, ID band Patient awake    Reviewed: Allergy & Precautions, H&P , NPO status   Airway Mallampati: I TM Distance: >3 FB Neck ROM: Full    Dental   Pulmonary          Cardiovascular     Neuro/Psych  Headaches, PSYCHIATRIC DISORDERS Depression    GI/Hepatic   Endo/Other    Renal/GU      Musculoskeletal   Abdominal   Peds  Hematology   Anesthesia Other Findings   Reproductive/Obstetrics                          Anesthesia Physical Anesthesia Plan  ASA: II  Anesthesia Plan: General   Post-op Pain Management:    Induction: Intravenous  Airway Management Planned: LMA  Additional Equipment:   Intra-op Plan:   Post-operative Plan: Extubation in OR  Informed Consent: I have reviewed the patients History and Physical, chart, labs and discussed the procedure including the risks, benefits and alternatives for the proposed anesthesia with the patient or authorized representative who has indicated his/her understanding and acceptance.     Plan Discussed with: CRNA and Surgeon  Anesthesia Plan Comments:         Anesthesia Quick Evaluation

## 2013-05-16 NOTE — Op Note (Signed)
NAME:  Kristine Ortega, Kristine Ortega.:  [id number]  MEDICAL RECORD NO.:  [id number]  LOCATION:                               FACILITY:  [facility]  PHYSICIAN:  Katy Fitch. Demiana Crumbley, M.D. DATE OF BIRTH:  1966/09/04  DATE OF PROCEDURE:  05/16/2013 DATE OF DISCHARGE:  05/16/2013                              OPERATIVE REPORT   PREOPERATIVE DIAGNOSIS:  Bilateral carpal tunnel syndrome.  POSTOPERATIVE DIAGNOSIS:  Bilateral carpal tunnel syndrome.  OPERATION:  Release of transverse carpal ligament.  OPERATING SURGEON:  Katy Fitch. Richrd Kuzniar, MD.  ASSISTANT:  Marveen Reeks Dasnoit, PA-C  ANESTHESIA:  General by LMA.  SUPERVISING ANESTHESIOLOGIST:  Siriah Treat is a 47 year old woman with a history of bilateral breast cancer status post mastectomy on the right in 2013 and mastectomy left 2005.  She had a history of bilateral hand numbness.  She was referred by her primary care physician, Dr. Drucie Ip for evaluation and management of her hand numbness.  We advised proceeding with clinical examination, electrodiagnostic studies.  Electrodiagnostic studies confirmed bilateral carpal tunnel syndrome.  She was brought to the operating this time anticipating release of right transverse carpal ligament.  Surgery aftercare potential risks benefits explained detail.  Questions were invited and answered.  Preoperatively, she was interviewed by Dr. Michelle Piper of Anesthesia.  He recommended general anesthesia by only technique.  After informed consent with Dr. Roby Lofts she is brought to the operating room at this time.  PROCEDURE IN DETAIL:  Clytee Heinrich was brought to room #2 at the [facility] placed in supine position on the operating table.  Following a detailed anesthesia informed consent by Dr. Michelle Piper, general anesthesia by LMA technique was induced under his direct supervision. The right hand and arm were then prepped with Betadine soap and solution, sterilely draped.  A pneumatic tourniquet was  applied to the proximal right brachium.  Following exsanguination right arm with Esmarch bandage, arterial tourniquet was inflated to 200 and 20 mmHg.  Following routine surgical time-out, the procedure commenced with a short incision in the line of the ring finger the palm.  Subcutaneous tissues were carefully divided with palmar fascia.  This was split longitudinally to the common sensory branch of the median nerve.  These were followed back to the transverse carpal ligament which was gently isolated the median nerve with a Insurance risk surveyor.  The transverse carpal ligament.  Then released with scissors along its ulnar border extending into the distal forearm.  This widely opened carpal canal.  No mass or other predicaments were noted.  Bleeding points along the margin of the released ligament were electrocauterized with bipolar forceps.  The wound was then repaired with intradermal 3-0 Prolene and Steri- Strips.  A 2% lidocaine was infiltrated for postoperative analgesia.  A compressive dressing was applied with a volar plaster splint maintaining the wrist in 10 degrees of dorsiflexion.  For aftercare, Ms. Suman is provided prescription for Percocet 5 mg 1 p.o. q.4-6 hours p.r.n. pain, #20 tablets without refill.  Assistants Merton Border thanks indication on Note:  Aubryana Vittorio. Nomi Rudnicki, M.D.   ______________________________ Katy Fitch. Caine Barfield, M.D.    RVS/MEDQ  D:  05/16/2013  T:  05/16/2013  Job:  [id number]

## 2013-05-17 ENCOUNTER — Encounter (HOSPITAL_BASED_OUTPATIENT_CLINIC_OR_DEPARTMENT_OTHER): Payer: Self-pay | Admitting: Orthopedic Surgery

## 2013-06-20 ENCOUNTER — Other Ambulatory Visit: Payer: Self-pay | Admitting: Orthopedic Surgery

## 2013-06-26 ENCOUNTER — Other Ambulatory Visit: Payer: Self-pay | Admitting: Orthopedic Surgery

## 2013-06-26 ENCOUNTER — Encounter (HOSPITAL_BASED_OUTPATIENT_CLINIC_OR_DEPARTMENT_OTHER): Payer: Self-pay | Admitting: *Deleted

## 2013-06-26 NOTE — Progress Notes (Signed)
Pt just added on for tomorrow-she did not know anything about it-may try to come-preop instructions given -she will call dr syphers office

## 2013-06-26 NOTE — Progress Notes (Signed)
Pt got in touch with office-to cancel and r/s in dec

## 2013-06-27 ENCOUNTER — Ambulatory Visit (HOSPITAL_BASED_OUTPATIENT_CLINIC_OR_DEPARTMENT_OTHER)
Admission: RE | Admit: 2013-06-27 | Payer: BC Managed Care – PPO | Source: Ambulatory Visit | Admitting: Orthopedic Surgery

## 2013-06-27 ENCOUNTER — Encounter (HOSPITAL_BASED_OUTPATIENT_CLINIC_OR_DEPARTMENT_OTHER): Admission: RE | Payer: Self-pay | Source: Ambulatory Visit

## 2013-06-27 SURGERY — CARPAL TUNNEL RELEASE
Anesthesia: General | Laterality: Left

## 2013-06-27 MED ORDER — LIDOCAINE HCL 2 % IJ SOLN
INTRAMUSCULAR | Status: AC
  Filled 2013-06-27: qty 20

## 2013-06-27 MED ORDER — MIDAZOLAM HCL 2 MG/2ML IJ SOLN
INTRAMUSCULAR | Status: AC
  Filled 2013-06-27: qty 2

## 2013-06-27 MED ORDER — METHYLPREDNISOLONE ACETATE 80 MG/ML IJ SUSP
INTRAMUSCULAR | Status: AC
  Filled 2013-06-27: qty 1

## 2013-06-27 MED ORDER — PROPOFOL 10 MG/ML IV BOLUS
INTRAVENOUS | Status: AC
  Filled 2013-06-27: qty 20

## 2013-06-27 MED ORDER — METHYLPREDNISOLONE ACETATE 40 MG/ML IJ SUSP
INTRAMUSCULAR | Status: AC
  Filled 2013-06-27: qty 1

## 2013-06-27 MED ORDER — FENTANYL CITRATE 0.05 MG/ML IJ SOLN
INTRAMUSCULAR | Status: AC
  Filled 2013-06-27: qty 2

## 2013-07-14 ENCOUNTER — Ambulatory Visit: Payer: BC Managed Care – PPO | Admitting: Family Medicine

## 2013-07-20 ENCOUNTER — Telehealth: Payer: Self-pay | Admitting: Family Medicine

## 2013-07-25 ENCOUNTER — Encounter: Payer: Self-pay | Admitting: Family Medicine

## 2013-07-25 ENCOUNTER — Ambulatory Visit (INDEPENDENT_AMBULATORY_CARE_PROVIDER_SITE_OTHER): Payer: BC Managed Care – PPO | Admitting: Family Medicine

## 2013-07-25 VITALS — BP 120/80 | HR 80 | Temp 98.9°F | Resp 20 | Ht 62.5 in | Wt 179.0 lb

## 2013-07-25 DIAGNOSIS — Z780 Asymptomatic menopausal state: Secondary | ICD-10-CM

## 2013-07-25 DIAGNOSIS — N951 Menopausal and female climacteric states: Secondary | ICD-10-CM

## 2013-07-25 DIAGNOSIS — M899 Disorder of bone, unspecified: Secondary | ICD-10-CM

## 2013-07-25 DIAGNOSIS — G43909 Migraine, unspecified, not intractable, without status migrainosus: Secondary | ICD-10-CM

## 2013-07-25 DIAGNOSIS — M8580 Other specified disorders of bone density and structure, unspecified site: Secondary | ICD-10-CM

## 2013-07-25 MED ORDER — SUMATRIPTAN SUCCINATE 100 MG PO TABS: 100.0000 mg | ORAL_TABLET | ORAL | Status: DC | PRN

## 2013-07-25 NOTE — Assessment & Plan Note (Signed)
I'll continue her other topical testosterone and estrogen. This is been called into her pharmacy  Advise her to look into the Pericidin C

## 2013-07-25 NOTE — Assessment & Plan Note (Signed)
She will continue her calcium and vitamin D she does not wish to have any further testing

## 2013-07-25 NOTE — Progress Notes (Addendum)
  Subjective:    Patient ID: Kristine Ortega, female    DOB: [birth year], 47 y.o.   MRN: [id number]  HPI  Patient here to followup medications. She has no specific concerns. She is a survivor per breast cancer she is status post mastectomy as well as hysterectomy and oophorectomy. She was being treated for postmenopausal symptoms associated with vaginal dryness with testosterone topical as well as estrogen by her GYN. She's been stable on this medication for the past 5 years she is no longer seen him on a regular basis. She's also decided to forego any type of screening/preventative labs or procedures. He states she does not want to know she has any other difficulties with her healthcare She was previously on Effexor which she weaned herself off of over the past 2 months. This is being used for postmenopausal hot flashes. She will like to know any other natural things that she could use  Also has a history of migraine disorder she was [facility]. She's been maintained on Imitrex which she uses once or twice a month she will like a refill on this today.  Review of Systems   GEN- denies fatigue, fever, weight loss,weakness, recent illness HEENT- denies eye drainage, change in vision, nasal discharge, CVS- denies chest pain, palpitations RESP- denies SOB, cough, wheeze ABD- denies N/V, change in stools, abd pain GU- denies dysuria, hematuria, dribbling, incontinence MSK- denies joint pain, muscle aches, injury Neuro- denies headache, dizziness, syncope, seizure activity      Objective:   Physical Exam GEN- NAD, alert and oriented x3 HEENT- PERRL, EOMI, non injected sclera, pink conjunctiva, MMM, oropharynx clear CVS- RRR, no murmur RESP-CTAB EXT- No edema Pulses- Radial2+        Assessment & Plan:

## 2013-07-25 NOTE — Assessment & Plan Note (Signed)
Imitrex refilled.

## 2013-07-25 NOTE — Patient Instructions (Addendum)
Peridin C - look into this online  I will refill meds for the year  F/u 1 year

## 2013-08-01 ENCOUNTER — Other Ambulatory Visit: Payer: Self-pay | Admitting: Orthopedic Surgery

## 2013-08-02 ENCOUNTER — Encounter (HOSPITAL_BASED_OUTPATIENT_CLINIC_OR_DEPARTMENT_OTHER): Payer: Self-pay | Admitting: *Deleted

## 2013-08-02 NOTE — Progress Notes (Signed)
Pt was here 9/14 for rt ctr-did well-general anesth No labs needed

## 2013-08-07 NOTE — H&P (Signed)
  Kristine Ortega is an 47 y.o. female.   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 She has a history of numbness and tingling in her hands, right worse than left. She has numbness every night. She relieves her dysesthesias by hanging her arms down. To date she has had no discomfort in her neck, no history of neck injury, and no history of hand or wrist injury. She has no history of diabetes, thyroid disease or gout. Her family history is positive for her mother having carpal tunnel syndrome in the middle years of life. She has had no injections.  Past Medical History  Diagnosis Date  . Cancer     breast cancer  . ZOXWRUEA(540.9)     Past Surgical History  Procedure Laterality Date  . Tonsillectomy    . Abdominal hysterectomy  2007    BSO  . Mastectomy w/ sentinel node biopsy  2005    left-  . Simple mastectomy  2005    right-prophalactic  . Breast reconstruction  2005    implants-bilat  . Port-a-cath removal  2007    insertion then out  . Cesarean section      x4  . Carpal tunnel release Right 05/16/2013    Procedure: CARPAL TUNNEL RELEASE RIGHT ;  Surgeon: Wyn Forster., MD;  Location: [facility];  Service: Orthopedics;  Laterality: Right;    Family History  Problem Relation Age of Onset  . Diabetes Mother   . Hypertension Mother   . Cancer Father     prostate  . Stroke Father    Social History:  reports that she has never smoked. She has never used smokeless tobacco. She reports that she does not drink alcohol or use illicit drugs.  Allergies: No Known Allergies  No prescriptions prior to admission    No results found for this or any previous visit (from the past 48 hour(s)).  No results found.   Pertinent items are noted in HPI.  Height 5' 2.5" (1.588 m), weight 80.74 kg (178  lb).  General appearance: alert Head: Normocephalic, without obvious abnormality Neck: supple, symmetrical, trachea midline Resp: clear Cardio: regular rate and rhythm GI: normal findings: bowel sounds normal Extremities:Inspection of her hands reveals no thenar atrophy. Her sweat patterns and dermatoglyphics are preserved. She has full ROM of her fingers in flexion/extension. She has all the historical features of carpal tunnel syndrome, right worse than left. Her provocative tests are positive. NCV studies:these revealed prolongation of the motor latency, sensory latency, and diminutive sensory amplitudes.   Pulses: 2+ and symmetric Skin: normal Neurologic: Grossly normal    Assessment/Plan Impression:  Left CTS  Plan: To the OR for left CTR.The procedure, risks,benefits and post-op course were discussed with the patient at length and they were in agreement with the plan.  DASNOIT,Kristine Ortega J 08/07/2013, 2:52 PM  H&P documentation: 08/08/2013  -History and Physical Reviewed  -Patient has been re-examined  -No change in the plan of care  Wyn Forster, MD

## 2013-08-08 ENCOUNTER — Ambulatory Visit (HOSPITAL_BASED_OUTPATIENT_CLINIC_OR_DEPARTMENT_OTHER)
Admission: RE | Admit: 2013-08-08 | Discharge: 2013-08-08 | Disposition: A | Discharge status: Home / Self Care | Payer: BC Managed Care – PPO | Source: Ambulatory Visit | Attending: Orthopedic Surgery | Admitting: Orthopedic Surgery

## 2013-08-08 ENCOUNTER — Ambulatory Visit (HOSPITAL_BASED_OUTPATIENT_CLINIC_OR_DEPARTMENT_OTHER): Admit: 2013-08-08 | Payer: BC Managed Care – PPO | Admitting: Orthopedic Surgery

## 2013-08-08 ENCOUNTER — Ambulatory Visit (HOSPITAL_BASED_OUTPATIENT_CLINIC_OR_DEPARTMENT_OTHER): Payer: BC Managed Care – PPO | Admitting: Anesthesiology

## 2013-08-08 ENCOUNTER — Encounter (HOSPITAL_BASED_OUTPATIENT_CLINIC_OR_DEPARTMENT_OTHER): Payer: Self-pay | Admitting: Orthopedic Surgery

## 2013-08-08 ENCOUNTER — Encounter (HOSPITAL_BASED_OUTPATIENT_CLINIC_OR_DEPARTMENT_OTHER): Payer: BC Managed Care – PPO | Admitting: Anesthesiology

## 2013-08-08 ENCOUNTER — Encounter (HOSPITAL_BASED_OUTPATIENT_CLINIC_OR_DEPARTMENT_OTHER)
Admission: RE | Disposition: A | Discharge status: Home / Self Care | Payer: Self-pay | Source: Ambulatory Visit | Attending: Orthopedic Surgery

## 2013-08-08 ENCOUNTER — Encounter (HOSPITAL_BASED_OUTPATIENT_CLINIC_OR_DEPARTMENT_OTHER): Payer: Self-pay

## 2013-08-08 DIAGNOSIS — G5600 Carpal tunnel syndrome, unspecified upper limb: Secondary | ICD-10-CM | POA: Insufficient documentation

## 2013-08-08 DIAGNOSIS — Z9010 Acquired absence of unspecified breast and nipple: Secondary | ICD-10-CM | POA: Insufficient documentation

## 2013-08-08 DIAGNOSIS — Z853 Personal history of malignant neoplasm of breast: Secondary | ICD-10-CM | POA: Insufficient documentation

## 2013-08-08 HISTORY — PX: CARPAL TUNNEL RELEASE: SHX101

## 2013-08-08 LAB — POCT HEMOGLOBIN-HEMACUE: Hemoglobin: 14.6 g/dL (ref 12.0–15.0)

## 2013-08-08 SURGERY — CARPAL TUNNEL RELEASE
Anesthesia: General | Laterality: Left

## 2013-08-08 SURGERY — CARPAL TUNNEL RELEASE
Anesthesia: General | Site: Wrist | Laterality: Left

## 2013-08-08 MED ORDER — MIDAZOLAM HCL 5 MG/5ML IJ SOLN
INTRAMUSCULAR | Status: DC | PRN
  Administered 2013-08-08: 2 mg via INTRAVENOUS

## 2013-08-08 MED ORDER — LIDOCAINE HCL 2 % IJ SOLN
INTRAMUSCULAR | Status: DC | PRN
  Administered 2013-08-08: 2 mL

## 2013-08-08 MED ORDER — OXYCODONE-ACETAMINOPHEN 5-325 MG PO TABS: ORAL_TABLET | ORAL | Status: DC

## 2013-08-08 MED ORDER — DEXAMETHASONE SODIUM PHOSPHATE 4 MG/ML IJ SOLN
INTRAMUSCULAR | Status: DC | PRN
  Administered 2013-08-08: 10 mg via INTRAVENOUS

## 2013-08-08 MED ORDER — BUPIVACAINE HCL (PF) 0.25 % IJ SOLN
INTRAMUSCULAR | Status: AC
  Filled 2013-08-08: qty 30

## 2013-08-08 MED ORDER — MIDAZOLAM HCL 2 MG/2ML IJ SOLN
INTRAMUSCULAR | Status: AC
  Filled 2013-08-08: qty 2

## 2013-08-08 MED ORDER — LACTATED RINGERS IV SOLN
INTRAVENOUS | Status: DC
  Administered 2013-08-08: 08:00:00 via INTRAVENOUS

## 2013-08-08 MED ORDER — OXYCODONE HCL 5 MG PO TABS
ORAL_TABLET | ORAL | Status: AC
  Filled 2013-08-08: qty 1

## 2013-08-08 MED ORDER — FENTANYL CITRATE 0.05 MG/ML IJ SOLN: 50.0000 ug | Freq: Once | INTRAMUSCULAR | Status: DC

## 2013-08-08 MED ORDER — PROPOFOL 10 MG/ML IV BOLUS
INTRAVENOUS | Status: DC | PRN
  Administered 2013-08-08: 200 mg via INTRAVENOUS

## 2013-08-08 MED ORDER — LIDOCAINE HCL (CARDIAC) 20 MG/ML IV SOLN
INTRAVENOUS | Status: DC | PRN
  Administered 2013-08-08: 100 mg via INTRAVENOUS

## 2013-08-08 MED ORDER — FENTANYL CITRATE 0.05 MG/ML IJ SOLN
INTRAMUSCULAR | Status: AC
  Filled 2013-08-08: qty 2

## 2013-08-08 MED ORDER — CHLORHEXIDINE GLUCONATE 4 % EX LIQD: 60.0000 mL | Freq: Once | CUTANEOUS | Status: DC

## 2013-08-08 MED ORDER — ONDANSETRON HCL 4 MG/2ML IJ SOLN
INTRAMUSCULAR | Status: DC | PRN
  Administered 2013-08-08: 4 mg via INTRAVENOUS

## 2013-08-08 MED ORDER — OXYCODONE HCL 5 MG/5ML PO SOLN: 5.0000 mg | Freq: Once | ORAL | Status: AC | PRN

## 2013-08-08 MED ORDER — OXYCODONE HCL 5 MG PO TABS
5.0000 mg | ORAL_TABLET | Freq: Once | ORAL | Status: AC | PRN
  Administered 2013-08-08: 5 mg via ORAL

## 2013-08-08 MED ORDER — MIDAZOLAM HCL 2 MG/2ML IJ SOLN: 1.0000 mg | INTRAMUSCULAR | Status: DC | PRN

## 2013-08-08 MED ORDER — PROPOFOL 10 MG/ML IV EMUL
INTRAVENOUS | Status: AC
  Filled 2013-08-08: qty 50

## 2013-08-08 MED ORDER — FENTANYL CITRATE 0.05 MG/ML IJ SOLN: 50.0000 ug | INTRAMUSCULAR | Status: DC | PRN

## 2013-08-08 MED ORDER — HYDROMORPHONE HCL PF 1 MG/ML IJ SOLN: 0.2500 mg | INTRAMUSCULAR | Status: DC | PRN

## 2013-08-08 MED ORDER — FENTANYL CITRATE 0.05 MG/ML IJ SOLN
INTRAMUSCULAR | Status: DC | PRN
  Administered 2013-08-08 (×2): 50 ug via INTRAVENOUS

## 2013-08-08 MED ORDER — PROMETHAZINE HCL 25 MG/ML IJ SOLN: 6.2500 mg | INTRAMUSCULAR | Status: DC | PRN

## 2013-08-08 SURGICAL SUPPLY — 40 items
BANDAGE ADHESIVE 1X3 (GAUZE/BANDAGES/DRESSINGS) IMPLANT
BANDAGE ELASTIC 3 VELCRO ST LF (GAUZE/BANDAGES/DRESSINGS) IMPLANT
BLADE SURG 15 STRL LF DISP TIS (BLADE) ×1 IMPLANT
BLADE SURG 15 STRL SS (BLADE) ×2
BNDG COHESIVE 3X5 TAN STRL LF (GAUZE/BANDAGES/DRESSINGS) ×2 IMPLANT
BNDG ESMARK 4X9 LF (GAUZE/BANDAGES/DRESSINGS) ×2 IMPLANT
BRUSH SCRUB EZ PLAIN DRY (MISCELLANEOUS) ×2 IMPLANT
CORDS BIPOLAR (ELECTRODE) ×2 IMPLANT
COVER MAYO STAND STRL (DRAPES) ×2 IMPLANT
COVER TABLE BACK 60X90 (DRAPES) ×2 IMPLANT
CUFF TOURNIQUET SINGLE 18IN (TOURNIQUET CUFF) IMPLANT
DECANTER SPIKE VIAL GLASS SM (MISCELLANEOUS) IMPLANT
DRAPE EXTREMITY T 121X128X90 (DRAPE) ×2 IMPLANT
DRAPE SURG 17X23 STRL (DRAPES) ×2 IMPLANT
GLOVE BIOGEL M STRL SZ7.5 (GLOVE) IMPLANT
GLOVE BIOGEL PI IND STRL 7.0 (GLOVE) ×2 IMPLANT
GLOVE BIOGEL PI INDICATOR 7.0 (GLOVE) ×2
GLOVE ECLIPSE 6.5 STRL STRAW (GLOVE) ×2 IMPLANT
GLOVE ORTHO TXT STRL SZ7.5 (GLOVE) ×2 IMPLANT
GOWN BRE IMP PREV XXLGXLNG (GOWN DISPOSABLE) ×2 IMPLANT
GOWN PREVENTION PLUS XLARGE (GOWN DISPOSABLE) ×2 IMPLANT
NDL SAFETY ECLIPSE 18X1.5 (NEEDLE) ×1 IMPLANT
NEEDLE 27GAX1X1/2 (NEEDLE) ×2 IMPLANT
NEEDLE HYPO 18GX1.5 SHARP (NEEDLE) ×1
PACK BASIN DAY SURGERY FS (CUSTOM PROCEDURE TRAY) ×2 IMPLANT
PAD CAST 3X4 CTTN HI CHSV (CAST SUPPLIES) ×1 IMPLANT
PADDING CAST ABS 4INX4YD NS (CAST SUPPLIES)
PADDING CAST ABS COTTON 4X4 ST (CAST SUPPLIES) IMPLANT
PADDING CAST COTTON 3X4 STRL (CAST SUPPLIES) ×1
SPLINT PLASTER CAST XFAST 3X15 (CAST SUPPLIES) ×5 IMPLANT
SPLINT PLASTER XTRA FASTSET 3X (CAST SUPPLIES) ×5
SPONGE GAUZE 4X4 12PLY (GAUZE/BANDAGES/DRESSINGS) IMPLANT
STOCKINETTE 4X48 STRL (DRAPES) ×2 IMPLANT
STRIP CLOSURE SKIN 1/2X4 (GAUZE/BANDAGES/DRESSINGS) ×2 IMPLANT
SUT PROLENE 3 0 PS 2 (SUTURE) ×2 IMPLANT
SYR 3ML 23GX1 SAFETY (SYRINGE) IMPLANT
SYR CONTROL 10ML LL (SYRINGE) IMPLANT
TOWEL OR 17X24 6PK STRL BLUE (TOWEL DISPOSABLE) ×2 IMPLANT
TRAY DSU PREP LF (CUSTOM PROCEDURE TRAY) ×2 IMPLANT
UNDERPAD 30X30 INCONTINENT (UNDERPADS AND DIAPERS) ×2 IMPLANT

## 2013-08-08 NOTE — Anesthesia Procedure Notes (Signed)
Procedure Name: LMA Insertion Date/Time: 08/08/2013 7:46 AM Performed by: Gar Gibbon Pre-anesthesia Checklist: Patient identified, Emergency Drugs available, Suction available and Patient being monitored Patient Re-evaluated:Patient Re-evaluated prior to inductionOxygen Delivery Method: Circle System Utilized Preoxygenation: Pre-oxygenation with 100% oxygen Intubation Type: IV induction Ventilation: Mask ventilation without difficulty LMA: LMA inserted LMA Size: 4.0 Number of attempts: 1 Airway Equipment and Method: bite block Placement Confirmation: positive ETCO2 Tube secured with: Tape Dental Injury: Teeth and Oropharynx as per pre-operative assessment

## 2013-08-08 NOTE — Anesthesia Postprocedure Evaluation (Signed)
  Anesthesia Post-op Note  Patient: Kristine Ortega  Procedure(s) Performed: Procedure(s): LEFT CARPAL TUNNEL RELEASE (Left)  Patient Location: PACU  Anesthesia Type:General  Level of Consciousness: awake  Airway and Oxygen Therapy: Patient Spontanous Breathing  Post-op Pain: mild  Post-op Assessment: Post-op Vital signs reviewed, Patient's Cardiovascular Status Stable, Respiratory Function Stable, Patent Airway, No signs of Nausea or vomiting and Pain level controlled  Post-op Vital Signs: Reviewed and stable  Complications: No apparent anesthesia complications

## 2013-08-08 NOTE — Op Note (Signed)
[id number] 

## 2013-08-08 NOTE — Transfer of Care (Signed)
Immediate Anesthesia Transfer of Care Note  Patient: Kristine Ortega  Procedure(s) Performed: Procedure(s): LEFT CARPAL TUNNEL RELEASE (Left)  Patient Location: PACU  Anesthesia Type:General  Level of Consciousness: awake, sedated and patient cooperative  Airway & Oxygen Therapy: Patient Spontanous Breathing and Patient connected to face mask oxygen  Post-op Assessment: Report given to PACU RN and Post -op Vital signs reviewed and stable  Post vital signs: Reviewed and stable  Complications: No apparent anesthesia complications

## 2013-08-08 NOTE — Brief Op Note (Signed)
08/08/2013  8:12 AM  PATIENT:  Kristine Ortega  47 y.o. female  PRE-OPERATIVE DIAGNOSIS:  Left Carpal Tunnel Syndrome  POST-OPERATIVE DIAGNOSIS:  Left Carpal Tunnel Syndrome  PROCEDURE:  Procedure(s): LEFT CARPAL TUNNEL RELEASE (Left)  SURGEON:  Surgeon(s) and Role:    * Wyn Forster., MD - Primary  PHYSICIAN ASSISTANT:   ASSISTANTS: Surgical technician  ANESTHESIA:   general  EBL:     BLOOD ADMINISTERED:none  DRAINS: none   LOCAL MEDICATIONS USED:  XYLOCAINE   SPECIMEN:  No Specimen  DISPOSITION OF SPECIMEN:  N/A  COUNTS:  YES  TOURNIQUET:   Total Tourniquet Time Documented: Upper Arm (Left) - 11 minutes Total: Upper Arm (Left) - 11 minutes   DICTATION: .Other Dictation: Dictation Number [id number]  PLAN OF CARE: Discharge to home after PACU  PATIENT DISPOSITION:  PACU - hemodynamically stable.   Delay start of Pharmacological VTE agent (>24hrs) due to surgical blood loss or risk of bleeding: not applicable

## 2013-08-08 NOTE — Anesthesia Preprocedure Evaluation (Signed)
Anesthesia Evaluation  Patient identified by MRN, date of birth, ID band Patient awake    Reviewed: Allergy & Precautions, H&P , NPO status   Airway Mallampati: I TM Distance: >3 FB Neck ROM: Full    Dental   Pulmonary  breath sounds clear to auscultation        Cardiovascular Rhythm:Regular Rate:Normal     Neuro/Psych  Headaches, PSYCHIATRIC DISORDERS Depression    GI/Hepatic   Endo/Other    Renal/GU      Musculoskeletal   Abdominal (+) + obese,   Peds  Hematology   Anesthesia Other Findings   Reproductive/Obstetrics                           Anesthesia Physical Anesthesia Plan  ASA: II  Anesthesia Plan: General   Post-op Pain Management:    Induction: Intravenous  Airway Management Planned: LMA  Additional Equipment:   Intra-op Plan:   Post-operative Plan: Extubation in OR  Informed Consent: I have reviewed the patients History and Physical, chart, labs and discussed the procedure including the risks, benefits and alternatives for the proposed anesthesia with the patient or authorized representative who has indicated his/her understanding and acceptance.     Plan Discussed with: CRNA and Surgeon  Anesthesia Plan Comments:         Anesthesia Quick Evaluation

## 2013-08-08 NOTE — Discharge Instructions (Addendum)

## 2013-08-09 ENCOUNTER — Encounter (HOSPITAL_BASED_OUTPATIENT_CLINIC_OR_DEPARTMENT_OTHER): Payer: Self-pay | Admitting: Orthopedic Surgery

## 2013-08-09 NOTE — Addendum Note (Signed)
Addendum created 08/09/13 [id number] by Tonette Bihari   Modules edited: Anesthesia Responsible Staff

## 2013-08-10 NOTE — Op Note (Signed)
NAME:  Kristine, Ortega.:  [id number]  MEDICAL RECORD NO.:  [id number]  LOCATION:                               FACILITY:  [facility]  PHYSICIAN:  Katy Fitch. Macoy Rodwell, M.D. 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  OPERATIONS:  Release of left transverse carpal ligament.  OPERATING SURGEON:  Josephine Igo, MD.  ASSISTANT:  Surgical technician.  ANESTHESIA:  General by LMA.  SUPERVISING ANESTHESIOLOGIST:  Bedelia Person, M.D.  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 Gilmore Laroche of [facility].  Clinical examination revealed evidence of bilateral carpal tunnel syndrome with significant median entrapment neuropathy bilaterally.  She is status post release of her right transverse carpal ligament with a very satisfactory result.  She now returns for release of her left transverse carpal ligament.  After informed consent, she was brought to the operating room at this time.  PROCEDURE IN DETAIL:  Kristine Ortega was interviewed in the holding area and her left hand marked as the proper surgical site per protocol with marking pen.  Question regarding the anticipated procedure were invited and answered in detail.  She noted that she had no difficulties with the Percocet provided postoperatively with her initial surgery.  She was interviewed by Dr. Gypsy Balsam of Anesthesia in the holding area and after detailed anesthesia informed consent, general anesthesia by LMA technique was recommended and accepted.  She was transferred to room #2 of the [facility] and placed in supine position on the  operating table.  Following the induction of general anesthesia by LMA technique under Dr. Burnett Corrente direct supervision, the left hand and arm were prepped with Betadine soap and solution and sterilely draped.  A pneumatic tourniquet was applied to the proximal left brachium and set at 220 mmHg.  following a routine surgical time-out, the left hand and arm were exsanguinated with an Esmarch bandage and the arterial tourniquet on the proximal brachium inflated to 220 mmHg.  Procedure commenced with a short incision in the line of the ring finger in the palm.  Subcutaneous tissues were carefully divided revealing the palmar fascia.  A small subcutaneous and possibly dermal nodule was noted.  It appeared to be possibly congenital and/or a long-standing.  This was not noted by [name] to be problematic, therefore, it was left unattended.  The palmar fascia was split longitudinally revealing the common sensory branches of the median nerve and superficial palmar arch.  The carpal canal was sounded with Penfield 4 elevator followed by careful release of the transverse carpal ligament with scissors along its ulnar border extending into the distal forearm.  The volar forearm fascia was released subcutaneously.  Bleeding points were electrocauterized with bipolar current followed by repair of the skin with intradermal 3-0 Prolene suture.  No masses or other predicaments were noted in the ulnar bursa.  The wound was then dressed with Steri-Strips, sterile gauze, sterile Webril, and a volar plaster splint.  A 2% lidocaine was infiltrated along the wound margins prior to dressing for postoperative comfort.  For aftercare, [name] was provided prescription for Percocet 5 mg 1 p.o. q.4-6 hours p.r.n. pain, #20 tabs without refill.  We will encourage her to use NSAIDs as her primary analgesic.     Katy Fitch Saryn Cherry, M.D.   ______________________________ Katy Fitch. Careena Degraffenreid,  M.D.    RVS/MEDQ  D:  08/08/2013  T:  08/08/2013  Job:  [id number]  cc:   Claude Manges, MD

## 2013-10-17 ENCOUNTER — Encounter: Payer: Self-pay | Admitting: Family Medicine

## 2013-10-17 ENCOUNTER — Ambulatory Visit (INDEPENDENT_AMBULATORY_CARE_PROVIDER_SITE_OTHER): Payer: BC Managed Care – PPO | Admitting: Family Medicine

## 2013-10-17 VITALS — BP 110/74 | HR 80 | Temp 98.7°F | Resp 14 | Ht 63.0 in | Wt 169.0 lb

## 2013-10-17 DIAGNOSIS — F411 Generalized anxiety disorder: Secondary | ICD-10-CM

## 2013-10-17 MED ORDER — VENLAFAXINE HCL ER 75 MG PO CP24: 150.0000 mg | ORAL_CAPSULE | Freq: Every day | ORAL | Status: DC

## 2013-10-17 NOTE — Progress Notes (Signed)
   Subjective:    Patient ID: Kristine Ortega, female    DOB: [birth year], 48 y.o.   MRN: [id number]  HPI Last year the patient independently discontinued Effexor.  She had been on medication for quite some time to prevent hot flashes after her treatment for breast cancer. She didn't think she needed the medication at that time. Later that fall she began developing symptoms of anxiety. After the first of this year it has gotten severe. He has a constant feeling of dread and anxiety. She is having frequent panic attacks characterized by a sensation that she cannot catch her breath. Chest frequent crying spells. She denies any depression. She has no suicidal ideation. She has no hallucinations or delusions. She is interesting in resuming Effexor. Past Medical History  Diagnosis Date  . Cancer     breast cancer  . ERDEYCXK(481.8)    Current Outpatient Prescriptions on File Prior to Visit  Medication Sig Dispense Refill  . calcium-vitamin D (OSCAL WITH D) 500-200 MG-UNIT per tablet Take 1 tablet by mouth 2 (two) times daily.      Marland Kitchen estradiol (ESTRACE) 0.1 MG/GM vaginal cream Place 2 g vaginally daily. 2 gm vaginally once weekly      . Multiple Vitamins-Minerals (MULTIVITAMIN WITH MINERALS) tablet Take 1 tablet by mouth daily.      . NON FORMULARY Testosterone 5%  based HRT cream Apply pea sized amount fives times a week  Syringes 73ml total / [facility]      . SUMAtriptan (IMITREX) 100 MG tablet Take 1 tablet (100 mg total) by mouth as needed.  20 tablet  3   No current facility-administered medications on file prior to visit.   No Known Allergies History   Social History  . Marital Status: Married    Spouse Name: N/A    Number of Children: N/A  . Years of Education: N/A   Occupational History  . Not on file.   Social History Main Topics  . Smoking status: Never Smoker   . Smokeless tobacco: Never Used  . Alcohol Use: No  . Drug Use: No  . Sexual Activity: Not on file    Other Topics Concern  . Not on file   Social History Narrative  . No narrative on file      Review of Systems  All other systems reviewed and are negative.       Objective:   Physical Exam  Vitals reviewed. Cardiovascular: Normal rate and regular rhythm.   Pulmonary/Chest: Effort normal and breath sounds normal.  Psychiatric: She has a normal mood and affect. Her behavior is normal. Judgment and thought content normal.          Assessment & Plan:  1. GAD (generalized anxiety disorder) Begin Effexor XR 75 mg by mouth every morning for 2 weeks and then increase to 150 mg by mouth every morning thereafter. Recheck in 6 weeks or immediately if worse. - venlafaxine XR (EFFEXOR-XR) 75 MG 24 hr capsule; Take 2 capsules (150 mg total) by mouth daily with breakfast.  Dispense: 60 capsule; Refill: 11

## 2013-10-25 ENCOUNTER — Telehealth: Payer: Self-pay | Admitting: Family Medicine

## 2013-10-25 NOTE — Telephone Encounter (Signed)
Pt called stating that she is taking Effexor 2 tabs now and she is still having problems and was wondering if you could call her in something else (Xanax or something like it). She is shaking, not eating, not drinking and feels very "run down".

## 2013-10-26 MED ORDER — ALPRAZOLAM 0.5 MG PO TABS: 0.5000 mg | ORAL_TABLET | Freq: Three times a day (TID) | ORAL | Status: DC

## 2013-10-26 NOTE — Telephone Encounter (Signed)
Ok with xanax 0.5 mg tab po q 8 hrs prn anxiety #30.

## 2013-10-26 NOTE — Telephone Encounter (Signed)
Med called into pharm and pt aware 

## 2013-11-01 ENCOUNTER — Other Ambulatory Visit: Payer: Self-pay | Admitting: *Deleted

## 2013-11-01 ENCOUNTER — Ambulatory Visit (INDEPENDENT_AMBULATORY_CARE_PROVIDER_SITE_OTHER): Payer: BC Managed Care – PPO | Admitting: Family Medicine

## 2013-11-01 ENCOUNTER — Ambulatory Visit: Payer: BC Managed Care – PPO | Admitting: Family Medicine

## 2013-11-01 ENCOUNTER — Encounter: Payer: Self-pay | Admitting: Family Medicine

## 2013-11-01 VITALS — BP 138/72 | HR 66 | Temp 98.3°F | Resp 20 | Ht 64.0 in | Wt 158.3 lb

## 2013-11-01 DIAGNOSIS — F411 Generalized anxiety disorder: Secondary | ICD-10-CM

## 2013-11-01 DIAGNOSIS — F329 Major depressive disorder, single episode, unspecified: Secondary | ICD-10-CM

## 2013-11-01 MED ORDER — ESCITALOPRAM OXALATE 10 MG PO TABS: 10.0000 mg | ORAL_TABLET | Freq: Every day | ORAL | Status: DC

## 2013-11-01 NOTE — Patient Instructions (Signed)
Start lexapro once a day at bedtime Decrease effexor to 1 capsule daily, for 1 week, then stop Return Monday Go to ER if you get worse

## 2013-11-01 NOTE — Telephone Encounter (Signed)
Pt called stating he wanted his wife medication sent to [facility] in stead of [place]

## 2013-11-03 DIAGNOSIS — F411 Generalized anxiety disorder: Secondary | ICD-10-CM | POA: Insufficient documentation

## 2013-11-03 DIAGNOSIS — F329 Major depressive disorder, single episode, unspecified: Secondary | ICD-10-CM | POA: Insufficient documentation

## 2013-11-03 NOTE — Assessment & Plan Note (Signed)
Advised to take xanax 1 tablet BID, during bridge with SSRI

## 2013-11-03 NOTE — Progress Notes (Signed)
Patient ID: Kristine Ortega, female   DOB: 1966-03-31, 48 y.o.   MRN: [id number]     Subjective:    Patient ID: Kristine Ortega, female    DOB: 23-Oct-1965, 48 y.o.   MRN: [id number]  Patient presents for medication Review  Pt seen about 2 weeks ago with severe anxiety and stress. SHe is here with her husband today. At that visit she was restarted on effexor which she has been on in the past for post-menopausal symptoms after her breast cancer treatments. Recently xanax was added on but she has only been taking 1/2 tablet daily as she was afraid she would get addicted to it. She has been crying daily sometimes non stop, she does not feel confident and has no interest in any daily activities. She has recently moved into a new home and feels the stress and sadness from leaving the home her children grew up in, also recently she became an empty nest. Finances have been stable but she worries over that as well. Her husband thinks all of this came on her at one time and she felt she couldn't get the upperhand on it all so she broke down. He has been very supportive of her, but he does work and often she is at home alone which makes her worry and sad. Sleep is poor, appetite is poor, no hallucinations She wants to try another medication as effexor is not helping her  Review Of Systems:  GEN- denies fatigue, fever, weight loss,weakness, recent illness HEENT- denies eye drainage, change in vision, nasal discharge, CVS- denies chest pain, palpitations RESP- denies SOB, cough, wheeze Neuro- denies headache, dizziness, syncope, seizure activity       Objective:    BP 138/72  Pulse 66  Temp(Src) 98.3 F (36.8 C)  Resp 20  Ht 5\' 4"  (1.626 m)  Wt 158 lb 4.8 oz (71.804 kg)  BMI 27.16 kg/m2 GEN- NAD, alert and oriented x3 Psych- crying throughout exam, depressed affect and someone anxious at times, shaking her legs, good eye contact, normal speech, no apparent hallucinations, no SI        Assessment  & Plan:      Problem List Items Addressed This Visit   MDD (major depressive disorder), single episode - Primary     No improvement with effexor, Her husband is with her today providing support and she will stay with him and her mother as she titrates medications for safety No red flags today, no SI Plan decrease effexor to 150mg  daily add lexapro10mg ,  Ultimately d/c effexor after 1 week, and titrate up on lexapro She will be seen on Monday for an interim visit and I will continue to adjust her meds    GAD (generalized anxiety disorder)     Advised to take xanax 1 tablet BID, during bridge with SSRI       Note: This dictation was prepared with Dragon dictation along with smaller phrase technology. Any transcriptional errors that result from this process are unintentional.

## 2013-11-03 NOTE — Assessment & Plan Note (Signed)
No improvement with effexor, Her husband is with her today providing support and she will stay with him and her mother as she titrates medications for safety No red flags today, no SI Plan decrease effexor to 150mg  daily add lexapro10mg ,  Ultimately d/c effexor after 1 week, and titrate up on lexapro She will be seen on Monday for an interim visit and I will continue to adjust her meds

## 2013-11-06 ENCOUNTER — Ambulatory Visit (INDEPENDENT_AMBULATORY_CARE_PROVIDER_SITE_OTHER): Payer: BC Managed Care – PPO | Admitting: Family Medicine

## 2013-11-06 ENCOUNTER — Encounter: Payer: Self-pay | Admitting: Family Medicine

## 2013-11-06 VITALS — BP 136/74 | HR 66 | Temp 98.3°F | Resp 20 | Ht 63.0 in | Wt 158.0 lb

## 2013-11-06 DIAGNOSIS — F411 Generalized anxiety disorder: Secondary | ICD-10-CM

## 2013-11-06 DIAGNOSIS — F329 Major depressive disorder, single episode, unspecified: Secondary | ICD-10-CM

## 2013-11-06 MED ORDER — ESCITALOPRAM OXALATE 20 MG PO TABS: 20.0000 mg | ORAL_TABLET | Freq: Every day | ORAL | Status: DC

## 2013-11-06 MED ORDER — ALPRAZOLAM 0.5 MG PO TABS: 0.5000 mg | ORAL_TABLET | Freq: Three times a day (TID) | ORAL | Status: DC

## 2013-11-06 NOTE — Progress Notes (Signed)
Patient ID: Kristine Ortega, female   DOB: 1966/04/02, 48 y.o.   MRN: [id number]   Subjective:    Patient ID: Kristine Ortega, female    DOB: June 23, 1966, 48 y.o.   MRN: [id number]  Patient presents for 1 week F/U  patient here for interim followup. She was seen for severe anxiety and major depression last Wednesday. She was on Effexor at that time as well as taking Xanax when necessary. Decision was made to taper off of the Effexor and start Lexapro 10 mg which she has done. She's also been taking the Xanax every 8 hours as prescribed on the bottle which has helped significantly. She was able to rest over the weekend and has not had severe crying spells. Her appetite is still poor but she is making herself get up and do her regular activities and try to eat. She denies any suicidal ideations. She's not had any vivid dreams or nightmares. Her husband is with her today.    Review Of Systems: - per above  GEN- denies fatigue, fever, weight loss,weakness, recent illness Neuro- denies headache, dizziness, syncope, seizure activity       Objective:    BP 136/74  Pulse 66  Temp(Src) 98.3 F (36.8 C)  Resp 20  Ht 5\' 3"  (1.6 m)  Wt 158 lb (71.668 kg)  BMI 28.00 kg/m2 GEN- NAD, alert and oriented x3 Psych- anxious appearing, good eye contact, no hallucinations, no SI, well groomed        Assessment & Plan:   Approx 15 minutes spent with pt > 50% spent on medication management and counseling   Problem List Items Addressed This Visit   MDD (major depressive disorder), single episode - Primary     Will plan to stop Effexor and increase Lexapro to 20 mg. From last Wednesday her mood is much improved though she still has a considerable amount of anxiety. I think we are headed in the right direction with her medications I see no red flags today. She will followup in 2 weeks    Relevant Medications      ALPRAZolam (XANAX) tablet      escitalopram (LEXAPRO) tablet   GAD (generalized anxiety  disorder)     Per above she will continue Xanax 3 times a day  Continue Lexapro       Note: This dictation was prepared with Dragon dictation along with smaller phrase technology. Any transcriptional errors that result from this process are unintentional.

## 2013-11-06 NOTE — Assessment & Plan Note (Signed)
Per above she will continue Xanax 3 times a day  Continue Lexapro

## 2013-11-06 NOTE — Patient Instructions (Signed)
Stop the effexor on Wed  Increase lexapro to 20mg  on Wed ( take 2 tablets at one time) Take xanax three times a day  F/U 2 weeks

## 2013-11-06 NOTE — Assessment & Plan Note (Addendum)
Will plan to stop Effexor and increase Lexapro to 20 mg. From last Wednesday her mood is much improved though she still has a considerable amount of anxiety. I think we are headed in the right direction with her medications I see no red flags today. She will followup in 2 weeks

## 2013-11-24 ENCOUNTER — Encounter: Payer: Self-pay | Admitting: Family Medicine

## 2013-11-24 ENCOUNTER — Ambulatory Visit (INDEPENDENT_AMBULATORY_CARE_PROVIDER_SITE_OTHER): Payer: BC Managed Care – PPO | Admitting: Family Medicine

## 2013-11-24 VITALS — BP 136/78 | HR 88 | Temp 98.3°F | Resp 18

## 2013-11-24 DIAGNOSIS — F329 Major depressive disorder, single episode, unspecified: Secondary | ICD-10-CM

## 2013-11-24 DIAGNOSIS — F411 Generalized anxiety disorder: Secondary | ICD-10-CM

## 2013-11-24 MED ORDER — ARIPIPRAZOLE 5 MG PO TABS: 5.0000 mg | ORAL_TABLET | Freq: Every day | ORAL | Status: DC

## 2013-11-24 NOTE — Patient Instructions (Signed)
Take 2 of the xanax if you are really anxious and shaky Continue lexapro Start abilify once a day  Return next Friday as scheduled

## 2013-11-26 ENCOUNTER — Encounter: Payer: Self-pay | Admitting: Family Medicine

## 2013-11-26 NOTE — Assessment & Plan Note (Signed)
I see no red flags in her family is very supportive suffer not send her to the hospital at this time. I'm going to add Abilify 5 mg to her current dose of Lexapro 20 mg I will also increase her Xanax to 1 mg 3 times a day she will followup in one week as previously scheduled

## 2013-11-26 NOTE — Assessment & Plan Note (Signed)
Per above medications changes Discussed with family when to take her to ER if needed

## 2013-11-26 NOTE — Progress Notes (Signed)
Patient ID: Kristine Ortega, female   DOB: 05-07-1966, 48 y.o.   MRN: [id number]       Subjective:    Patient ID: Kristine Ortega, female    DOB: Apr 06, 1966, 48 y.o.   MRN: [id number]  Patient presents for anxiety  patient is here with her parents. At her last visit about 2 weeks ago her Lexapro was increased to 20 mg she's also taking Xanax 0.5 mg up to 3 times a day. She's had both good and bad days. He states that her mind is occupied that she does fairly well which does not have the severe anxiety and shaking the past 3 days that have been very rough she states she was keeping things bottled up inside and she's now been crying uncontrollably and very anxious and does not feel like she is going to get better. Her family reiterates that her stressors have come from moving as well as her son leaving in her pain unable to control the situation. She's not had any suicidal ideations or any hallucinations. Her mother is here who also has a history of bipolar and depression. She's not had any manic-like behavior and is sleeping fairly well. She does state that the Xanax helps her but sometimes it needs to be a little stronger. Her family is keeping a close eye on her as her husband does work very long hours. She states that she came in today to get more reassurance that she will improve with her medications    Review Of Systems:  GEN- denies fatigue, fever, weight loss,weakness, recent illness HEENT- denies eye drainage, change in vision, nasal discharge, CVS- denies chest pain, palpitations RESP- denies SOB, cough, wheeze Neuro- denies headache, dizziness, syncope, seizure activity       Objective:    BP 136/78  Pulse 88  Temp(Src) 98.3 F (36.8 C)  Resp 18 GEN- NAD, alert and oriented x3 Psych- anxious - shaking in chair, crying, depressed affect, no hallucinations, no SI, well groomed, normal speech, good eye contact       Assessment & Plan:      Problem List Items Addressed This  Visit   None      Note: This dictation was prepared with Dragon dictation along with smaller phrase technology. Any transcriptional errors that result from this process are unintentional.

## 2013-11-28 ENCOUNTER — Telehealth: Payer: Self-pay | Admitting: *Deleted

## 2013-11-28 MED ORDER — ALPRAZOLAM 0.5 MG PO TABS: 0.5000 mg | ORAL_TABLET | Freq: Three times a day (TID) | ORAL | Status: DC | PRN

## 2013-11-28 NOTE — Telephone Encounter (Signed)
Call placed to patient and patient made aware.   Appointment scheduled.  

## 2013-11-28 NOTE — Telephone Encounter (Signed)
Change her xanax to 1mg  every 8 hours as needed  # 90 R 1 Change appt to April 3rd

## 2013-11-28 NOTE — Telephone Encounter (Signed)
Received call from patient.   Reports that pharmacy has not been able to fill Abilify due to back-order. States that she will be able to pick up medication today.   Reports that she has F/U appointment on Friday, 12/01/2013 to check effectiveness of Abilify, but since she has not been taking medications, she was concerned that medication trial has not been long enough.   Requested MD to advise on moving appointment. States that she is still shaky, but not in complete meltdown.   Also reported that if appointment on Friday is rescheduled, she would need refill on Xanax.   MD please advise.

## 2013-11-29 ENCOUNTER — Ambulatory Visit: Payer: BC Managed Care – PPO | Admitting: Family Medicine

## 2013-12-01 ENCOUNTER — Ambulatory Visit: Payer: BC Managed Care – PPO | Admitting: Family Medicine

## 2013-12-04 ENCOUNTER — Other Ambulatory Visit: Payer: Self-pay | Admitting: *Deleted

## 2013-12-04 ENCOUNTER — Telehealth: Payer: Self-pay | Admitting: Family Medicine

## 2013-12-04 MED ORDER — ALPRAZOLAM 1 MG PO TABS: 1.0000 mg | ORAL_TABLET | Freq: Three times a day (TID) | ORAL | Status: DC | PRN

## 2013-12-04 NOTE — Telephone Encounter (Signed)
Call back number is [phone] Pharmacy [facility] Pt is needing a refill on her xanax

## 2013-12-04 NOTE — Telephone Encounter (Signed)
Per notes on visit on 11/24/2013,  Xanax 1mg  TID PRN called to pharmacy.

## 2013-12-04 NOTE — Telephone Encounter (Signed)
Medication called to pharmacy per guidelines noted in last office visit on 11/24/2013.

## 2013-12-08 ENCOUNTER — Ambulatory Visit (INDEPENDENT_AMBULATORY_CARE_PROVIDER_SITE_OTHER): Payer: BC Managed Care – PPO | Admitting: Family Medicine

## 2013-12-08 ENCOUNTER — Encounter: Payer: Self-pay | Admitting: Family Medicine

## 2013-12-08 VITALS — BP 132/78 | HR 72 | Temp 97.8°F | Resp 14 | Ht 63.5 in | Wt 157.0 lb

## 2013-12-08 DIAGNOSIS — F411 Generalized anxiety disorder: Secondary | ICD-10-CM

## 2013-12-08 DIAGNOSIS — F329 Major depressive disorder, single episode, unspecified: Secondary | ICD-10-CM

## 2013-12-08 MED ORDER — ESCITALOPRAM OXALATE 20 MG PO TABS: 20.0000 mg | ORAL_TABLET | Freq: Every day | ORAL | Status: DC

## 2013-12-08 MED ORDER — ARIPIPRAZOLE 5 MG PO TABS: 5.0000 mg | ORAL_TABLET | Freq: Every day | ORAL | Status: DC

## 2013-12-08 NOTE — Assessment & Plan Note (Signed)
She has improved with the addition of Abilify with her Lexapro. Will continue at the current dose of 5 mg no change in her medications. We'll followup in 6 weeks' time to assure stability before letting her go longer

## 2013-12-08 NOTE — Assessment & Plan Note (Signed)
Per above continue current regimen I will also continue the Xanax for now.

## 2013-12-08 NOTE — Patient Instructions (Signed)
Continue the current medications Okay to take 1/2 tablet of xanax on good days and up to 1 tablet a day  F/U 6 weeks

## 2013-12-08 NOTE — Progress Notes (Signed)
Patient ID: Kristine Ortega, female   DOB: May 16, 1966, 48 y.o.   MRN: [id number]   Subjective:    Patient ID: Kristine Ortega, female    DOB: [birth year], 48 y.o.   MRN: [id number]  Patient presents for F/U medication changes  Patient here to followup medications. She was seen about 2 weeks ago at that time she was started on Abilify 5 mg. Today she is happy and smiling states that she's felt better than she has in a few months. She's not had any crying episodes since her last visit she actually when out to breakfast with her friends this morning and has been getting up and doing things around the house without feeling burden. She states that she feels like a 7/10 with 10 being in  Back to her regular self. She is taking Xanax 3 times a day also we'll just take a half a tablet daily she is very stressed and she'll take the whole tablet she's also taking her Lexapro as prescribed. She is sleeping and eating well. She's actually been seen back at the house by herself or her husband is at work and does not have any difficulties.   Review Of Systems:  GEN- denies fatigue, fever, weight loss,weakness, recent illness HEENT- denies eye drainage, change in vision, nasal discharge, CVS- denies chest pain, palpitations RESP- denies SOB, cough, wheeze Neuro- denies headache, dizziness, syncope, seizure activity       Objective:    BP 132/78  Pulse 72  Temp(Src) 97.8 F (36.6 C) (Oral)  Resp 14  Ht 5' 3.5" (1.613 m)  Wt 157 lb (71.215 kg)  BMI 27.37 kg/m2 GEN- NAD, alert and oriented x3 Psych- well groomed, good eye contact, no tremor or shaking, normal speech, happy and smiling, not depressed appearing,        Assessment & Plan:      Problem List Items Addressed This Visit   None      Note: This dictation was prepared with Dragon dictation along with smaller phrase technology. Any transcriptional errors that result from this process are unintentional.

## 2013-12-26 ENCOUNTER — Other Ambulatory Visit: Payer: Self-pay | Admitting: *Deleted

## 2013-12-26 MED ORDER — ESCITALOPRAM OXALATE 20 MG PO TABS: 20.0000 mg | ORAL_TABLET | Freq: Every day | ORAL | Status: DC

## 2013-12-26 NOTE — Telephone Encounter (Signed)
Refill appropriate and filled per protocol. 

## 2014-01-19 ENCOUNTER — Ambulatory Visit (INDEPENDENT_AMBULATORY_CARE_PROVIDER_SITE_OTHER): Payer: BC Managed Care – PPO | Admitting: Family Medicine

## 2014-01-19 ENCOUNTER — Encounter: Payer: Self-pay | Admitting: Family Medicine

## 2014-01-19 VITALS — BP 106/74 | HR 64 | Temp 98.2°F | Resp 18 | Wt 162.0 lb

## 2014-01-19 DIAGNOSIS — R350 Frequency of micturition: Secondary | ICD-10-CM

## 2014-01-19 DIAGNOSIS — F329 Major depressive disorder, single episode, unspecified: Secondary | ICD-10-CM

## 2014-01-19 DIAGNOSIS — N390 Urinary tract infection, site not specified: Secondary | ICD-10-CM

## 2014-01-19 DIAGNOSIS — F411 Generalized anxiety disorder: Secondary | ICD-10-CM

## 2014-01-19 LAB — URINALYSIS, ROUTINE W REFLEX MICROSCOPIC
Bilirubin Urine: NEGATIVE
Glucose, UA: NEGATIVE mg/dL
Hgb urine dipstick: NEGATIVE
Ketones, ur: NEGATIVE mg/dL
Leukocytes, UA: NEGATIVE
Nitrite: POSITIVE — AB
PROTEIN: NEGATIVE mg/dL
Specific Gravity, Urine: 1.02 (ref 1.005–1.030)
Urobilinogen, UA: 0.2 mg/dL (ref 0.0–1.0)
pH: 5.5 (ref 5.0–8.0)

## 2014-01-19 LAB — URINALYSIS, MICROSCOPIC ONLY
Bacteria, UA: NONE SEEN
CRYSTALS: NONE SEEN
Casts: NONE SEEN
RBC / HPF: NONE SEEN RBC/hpf (ref <3)
SQUAMOUS EPITHELIAL / LPF: NONE SEEN

## 2014-01-19 MED ORDER — ARIPIPRAZOLE 10 MG PO TABS: 10.0000 mg | ORAL_TABLET | Freq: Every day | ORAL | Status: DC

## 2014-01-19 MED ORDER — CEPHALEXIN 500 MG PO CAPS: 500.0000 mg | ORAL_CAPSULE | Freq: Two times a day (BID) | ORAL | Status: DC

## 2014-01-19 MED ORDER — ESTRADIOL 0.1 MG/GM VA CREA: 1.0000 | TOPICAL_CREAM | VAGINAL | Status: DC

## 2014-01-19 NOTE — Patient Instructions (Signed)
Take the keflex for UTI Increase abilify to 10mg  Take 1mg  of xanax at bedtime, okay to use 1/2 tablet during the day  F/U 6 weeks

## 2014-01-19 NOTE — Progress Notes (Signed)
Patient ID: Kristine Ortega, female   DOB: [birth year], 48 y.o.   MRN: [id number]   Subjective:    Patient ID: Kristine Ortega, female    DOB: 1965-10-01, 48 y.o.   MRN: [id number]  Patient presents for 1 mth follow up  patient here to followup chronic medical problems. She's been treated for major depression as well as anxiety. She states that she feels like a 7/10 were 10 would be her normal self. She's not having the crying episodes anymore but still feels anxious every day like starting a new job anxious. Her sleep is sometimes interfered and she will wake up in the middle of the night and her mind is racing this becomes a little anxious. She states that she still feels depressed at times like she has no hope that she will get back to her previous self she's been trying different activities that she used to enjoy but finds no joy in these as well. Her husband is still concerned about her depression She is wondering if she should continue Ativan at bedtime switch to Tylenol PM. She did try taking a couple of doses of Tylenol PM but did not notice any difference in her sleep. Of note she typically takes a half a tablet of the Ativan 2-3 times a day. During the daytime to 1 mg makes her drowsy. She is trying to stay active and keep her mind busy.  She also complains of dysuria and urinary frequency for the past 2 days. She had some mild blood when she wiped as well. No change in her bowel movements. No fever no abdominal pain    Review Of Systems:  GEN- denies fatigue, fever, weight loss,weakness, recent illness HEENT- denies eye drainage, change in vision, nasal discharge, CVS- denies chest pain, palpitations RESP- denies SOB, cough, wheeze ABD- denies N/V, change in stools, abd pain GU- +dysuria, +hematuria, dribbling, incontinence MSK- denies joint pain, muscle aches, injury Neuro- denies headache, dizziness, syncope, seizure activity       Objective:    BP 106/74  Pulse 64  Temp(Src)  98.2 F (36.8 C) (Oral)  Resp 18  Wt 162 lb (73.483 kg) GEN- NAD, alert and oriented x3 HEENT- PERRL, EOMI, non injected sclera, pink conjunctiva, MMM, oropharynx clear CVS- RRR, no murmur RESP-CTAB ABD-NABS,soft,NT,ND Psych- smiling, not depressed or anxious appearing, good eye contact, normal speech, no apparent SI EXT- No edema Pulses- Radial, DP- 2+        Assessment & Plan:      Problem List Items Addressed This Visit   None    Visit Diagnoses   Frequency of urination    -  Primary    Relevant Orders       Urinalysis, Routine w reflex microscopic (Completed)       Note: This dictation was prepared with Dragon dictation along with smaller phrase technology. Any transcriptional errors that result from this process are unintentional.

## 2014-01-19 NOTE — Assessment & Plan Note (Signed)
I will increase her Abilify 10 mg she'll continue Lexapro to 20 mg. Increase her Xanax to 1 mg at bedtime she will take a half a tablet during the daytime. . I think this will help with her sleep taken to 1 mg at bedtime She's a significant improvement over the past 2 months with her mood and overall presentation. Continue to encourage daily activities and getting back into her routine Has a very supportive family

## 2014-01-19 NOTE — Assessment & Plan Note (Signed)
Will give her course of Keflex. She has taken a couple days of AZO

## 2014-02-02 ENCOUNTER — Telehealth: Payer: Self-pay | Admitting: *Deleted

## 2014-02-02 MED ORDER — HYDROXYZINE HCL 10 MG PO TABS: 10.0000 mg | ORAL_TABLET | Freq: Three times a day (TID) | ORAL | Status: DC | PRN

## 2014-02-02 NOTE — Telephone Encounter (Signed)
Received call from patient.   Reports that Abilify was increased on 01/19/2014, but she is not feeling any different.   States that she feels that the medication is not effective, and is requesting MD advice.   Reports that she feels like she is slipping backwards after progress made.   MD please advise.

## 2014-02-02 NOTE — Telephone Encounter (Signed)
Have her continue the abilify at 10mg  and the lexapro Use hydroxzyine 25mg  BID as needed for anxiety # 30  R 1 - let her know dont take with the ativan as it will make her sleepy Make appt for next week

## 2014-02-02 NOTE — Telephone Encounter (Signed)
Call placed to patient and patient made aware.   Appointment scheduled.  

## 2014-02-06 ENCOUNTER — Encounter: Payer: Self-pay | Admitting: Family Medicine

## 2014-02-06 ENCOUNTER — Ambulatory Visit (INDEPENDENT_AMBULATORY_CARE_PROVIDER_SITE_OTHER): Payer: BC Managed Care – PPO | Admitting: Family Medicine

## 2014-02-06 VITALS — BP 128/72 | HR 72 | Temp 98.3°F | Resp 16 | Ht 63.5 in | Wt 160.0 lb

## 2014-02-06 DIAGNOSIS — F329 Major depressive disorder, single episode, unspecified: Secondary | ICD-10-CM

## 2014-02-06 DIAGNOSIS — F411 Generalized anxiety disorder: Secondary | ICD-10-CM

## 2014-02-06 NOTE — Assessment & Plan Note (Signed)
The Lexapro does not seem to be holding her depression even with the addition of the Abilify as an adjunct. She does think overall she is much improved from where she was before but now finding herself slipping more towards her depression in. She's not had any hypomanic or manic episodes though she does her family history of bipolar. I will try her on the new Brintillex for depression she'll continue the Abilify a cystoscopy with the family history of bipolar and it is a good adjunct for depression. We will continue the hydroxyzine as this makes her less sedated and helps more with her anxiety and tremors We did discuss psychiatry, she declines therapy, but is open to seeing psych for prescription medications

## 2014-02-06 NOTE — Patient Instructions (Signed)
Decrease lexapro to 10mg  once a day for 1 week, then stop  Start the brintillex for depression Continue the abilify and the hydroxyzine anxiety medications F/U 2 weeks

## 2014-02-06 NOTE — Progress Notes (Signed)
Patient ID: Kristine Ortega, female   DOB: [birth year], 48 y.o.   MRN: [id number]   Subjective:    Patient ID: Kristine Ortega, female    DOB: 04-Nov-1965, 48 y.o.   MRN: [id number]  Patient presents for F/U medication changes  patient here to followup medication changes. Her last visit I increased her Abilify 10 mg however she called in stating that she still felt very anxious and jittery and that the Xanax which is making her more sleepy. Therefore I we had her start hydroxyzine 10 mg 3 times a day as needed she states that this works much better for her anxiety and the trembling she is now down to taking only a fourth a tablet of Xanax at a time. She does note however that the past couple weeks her depression is increasing she does not think the Lexapro is helping her depression. She queried about Zoloft as her mother has taken this in the past. Her anxiety seems better but she just has more depressed days where she is sad and feels like she can't get out of bed. She's had some increased crying episodes as well. There've been no new stressors in her home.    Review Of Systems:  GEN- denies fatigue, fever, weight loss,weakness, recent illness HEENT- denies eye drainage, change in vision, nasal discharge, CVS- denies chest pain, palpitations RESP- denies SOB, cough, wheeze ABD- denies N/V, change in stools, abd pain Neuro- denies headache, dizziness, syncope, seizure activity       Objective:    BP 128/72  Pulse 72  Temp(Src) 98.3 F (36.8 C) (Oral)  Resp 16  Ht 5' 3.5" (1.613 m)  Wt 160 lb (72.576 kg)  BMI 27.89 kg/m2 GEN- NAD, alert and oriented x3 Psych- Good eye contact, no suicidal ideations normal speech. Depressed affect today no crying some mild anxiety also noted        Assessment & Plan:      Problem List Items Addressed This Visit   None      Note: This dictation was prepared with Dragon dictation along with smaller phrase technology. Any transcriptional errors that  result from this process are unintentional.

## 2014-02-12 ENCOUNTER — Telehealth: Payer: Self-pay | Admitting: Family Medicine

## 2014-02-12 MED ORDER — VORTIOXETINE HBR 10 MG PO TABS: 1.0000 | ORAL_TABLET | Freq: Every day | ORAL | Status: DC

## 2014-02-12 NOTE — Telephone Encounter (Signed)
[phone]   PT will run out of medication before her next apt and she was given samples of Brintellix and I didn't see any back there and she is wanting to know if any could be called in for her  Pharmacy [facility]

## 2014-02-12 NOTE — Telephone Encounter (Signed)
Prescription sent to pharmacy.   Call placed to patient and patient made aware per VM. 

## 2014-02-12 NOTE — Telephone Encounter (Signed)
Refill 10mg  Brintillex

## 2014-02-12 NOTE — Telephone Encounter (Signed)
MD please advise on dosing.

## 2014-02-14 ENCOUNTER — Other Ambulatory Visit: Payer: BC Managed Care – PPO

## 2014-02-14 ENCOUNTER — Ambulatory Visit: Payer: BC Managed Care – PPO

## 2014-02-19 ENCOUNTER — Encounter: Payer: Self-pay | Admitting: Family Medicine

## 2014-02-19 ENCOUNTER — Ambulatory Visit (INDEPENDENT_AMBULATORY_CARE_PROVIDER_SITE_OTHER): Payer: BC Managed Care – PPO | Admitting: Family Medicine

## 2014-02-19 VITALS — BP 128/78 | HR 76 | Temp 98.1°F | Resp 18 | Ht 64.0 in | Wt 158.0 lb

## 2014-02-19 DIAGNOSIS — R5383 Other fatigue: Secondary | ICD-10-CM

## 2014-02-19 DIAGNOSIS — R209 Unspecified disturbances of skin sensation: Secondary | ICD-10-CM

## 2014-02-19 DIAGNOSIS — R5381 Other malaise: Secondary | ICD-10-CM

## 2014-02-19 DIAGNOSIS — R202 Paresthesia of skin: Secondary | ICD-10-CM

## 2014-02-19 DIAGNOSIS — F329 Major depressive disorder, single episode, unspecified: Secondary | ICD-10-CM

## 2014-02-19 DIAGNOSIS — R413 Other amnesia: Secondary | ICD-10-CM

## 2014-02-19 DIAGNOSIS — F411 Generalized anxiety disorder: Secondary | ICD-10-CM

## 2014-02-19 NOTE — Progress Notes (Signed)
Patient ID: Kristine Ortega, female   DOB: 12/28/1965, 48 y.o.   MRN: [id number]   Subjective:    Patient ID: Kristine Ortega, female    DOB: Aug 27, 1966, 48 y.o.   MRN: [id number]  Patient presents for F/U medications  patient here follow up medications at her last visit she was started on brintillix 10mg , she thinks his medication is doing much better for her depression she continues to have anxiety but typically only shakes on her right hand now. She also noted some sensation changes over the past month or 2 where she has a numbness feeling in her hands and her feet she does related to her her medicines. She also does not think very clearheaded and cannot concentrate as well as she did before. She still to Xanax 0.5 mg twice a day and a half a milligram at bedtime she's also taking her pain 10 mg twice a day as well as her Abilify    Review Of Systems:  GEN- + fatigue, fever, weight loss,weakness, recent illness HEENT- denies eye drainage, change in vision, nasal discharge, CVS- denies chest pain, palpitations RESP- denies SOB, cough, wheeze ABD- denies N/V, change in stools, abd pain GU- denies dysuria, hematuria, dribbling, incontinence MSK- denies joint pain, muscle aches, injury Neuro- denies headache, dizziness, syncope, seizure activity       Objective:    BP 128/78  Pulse 76  Temp(Src) 98.1 F (36.7 C) (Oral)  Resp 18  Ht 5\' 4"  (1.626 m)  Wt 158 lb (71.668 kg)  BMI 27.11 kg/m2 GEN- NAD, alert and oriented x3 HEENT- PERRL, EOMI, non injected sclera, pink conjunctiva, MMM, oropharynx clear Neck- Supple, no thyromegaly CVS- RRR, no murmur RESP-CTAB NEURO-CNII-XII in tact, decreased monofilament and hands and feet, tremor of right hand Psych- depressed affect, a little anxious appearing, well groomed, good eye contact, no SI, normal speech EXT- No edema Pulses- Radial, DP- 2+        Assessment & Plan:      Problem List Items Addressed This Visit   MDD (major  depressive disorder), single episode - Continue Brintillex, reassess in 2 weeks to see if this needs to be increased to 20 mg. She will continue the hydroxyzine, Abilify and Xanax the current dose    GAD (generalized anxiety disorder)    Other Visit Diagnoses   Memory change    -  Primary    Check B12 and basic labs, I think depression is hte major culprit, but the other neurological deficits, make me query a secondary process, no head trauma, no recent infection    Relevant Orders       CBC with Differential       Comprehensive metabolic panel       TSH    Other malaise and fatigue        Relevant Orders       Vitamin B12    Paresthesia        we may need brain imaging, f/u labs first       Note: This dictation was prepared with Dragon dictation along with smaller phrase technology. Any transcriptional errors that result from this process are unintentional.

## 2014-02-19 NOTE — Patient Instructions (Signed)
Continue current medication  I will check the labs, we may need get MRI of brain  F/U by phone in 2 weeks

## 2014-02-20 LAB — COMPREHENSIVE METABOLIC PANEL
ALK PHOS: 59 U/L (ref 39–117)
ALT: 11 U/L (ref 0–35)
AST: 14 U/L (ref 0–37)
Albumin: 4.5 g/dL (ref 3.5–5.2)
BILIRUBIN TOTAL: 0.3 mg/dL (ref 0.2–1.2)
BUN: 14 mg/dL (ref 6–23)
CO2: 27 mEq/L (ref 19–32)
Calcium: 9.3 mg/dL (ref 8.4–10.5)
Chloride: 105 mEq/L (ref 96–112)
Creat: 0.75 mg/dL (ref 0.50–1.10)
Glucose, Bld: 84 mg/dL (ref 70–99)
Potassium: 4.6 mEq/L (ref 3.5–5.3)
SODIUM: 144 meq/L (ref 135–145)
TOTAL PROTEIN: 6.8 g/dL (ref 6.0–8.3)

## 2014-02-20 LAB — CBC WITH DIFFERENTIAL/PLATELET
BASOS ABS: 0.1 10*3/uL (ref 0.0–0.1)
BASOS PCT: 1 % (ref 0–1)
Eosinophils Absolute: 0.1 10*3/uL (ref 0.0–0.7)
Eosinophils Relative: 2 % (ref 0–5)
HCT: 40.4 % (ref 36.0–46.0)
Hemoglobin: 13.5 g/dL (ref 12.0–15.0)
Lymphocytes Relative: 37 % (ref 12–46)
Lymphs Abs: 2.2 10*3/uL (ref 0.7–4.0)
MCH: 27.9 pg (ref 26.0–34.0)
MCHC: 33.4 g/dL (ref 30.0–36.0)
MCV: 83.5 fL (ref 78.0–100.0)
Monocytes Absolute: 0.4 10*3/uL (ref 0.1–1.0)
Monocytes Relative: 7 % (ref 3–12)
NEUTROS PCT: 53 % (ref 43–77)
Neutro Abs: 3.2 10*3/uL (ref 1.7–7.7)
PLATELETS: 223 10*3/uL (ref 150–400)
RBC: 4.84 MIL/uL (ref 3.87–5.11)
RDW: 13.4 % (ref 11.5–15.5)
WBC: 6 10*3/uL (ref 4.0–10.5)

## 2014-02-20 LAB — TSH: TSH: 0.565 u[IU]/mL (ref 0.350–4.500)

## 2014-02-20 LAB — VITAMIN B12: Vitamin B-12: 588 pg/mL (ref 211–911)

## 2014-02-23 ENCOUNTER — Ambulatory Visit: Payer: BC Managed Care – PPO | Admitting: Family Medicine

## 2014-03-02 ENCOUNTER — Ambulatory Visit (INDEPENDENT_AMBULATORY_CARE_PROVIDER_SITE_OTHER): Payer: BC Managed Care – PPO | Admitting: Family Medicine

## 2014-03-02 ENCOUNTER — Ambulatory Visit: Payer: BC Managed Care – PPO | Admitting: Family Medicine

## 2014-03-02 ENCOUNTER — Encounter: Payer: Self-pay | Admitting: Family Medicine

## 2014-03-02 VITALS — BP 118/68 | HR 64 | Temp 98.0°F | Resp 16 | Ht 63.0 in | Wt 155.0 lb

## 2014-03-02 DIAGNOSIS — R259 Unspecified abnormal involuntary movements: Secondary | ICD-10-CM

## 2014-03-02 DIAGNOSIS — F322 Major depressive disorder, single episode, severe without psychotic features: Secondary | ICD-10-CM

## 2014-03-02 DIAGNOSIS — F411 Generalized anxiety disorder: Secondary | ICD-10-CM

## 2014-03-02 DIAGNOSIS — R251 Tremor, unspecified: Secondary | ICD-10-CM

## 2014-03-02 MED ORDER — HYDROXYZINE HCL 10 MG PO TABS: 10.0000 mg | ORAL_TABLET | Freq: Three times a day (TID) | ORAL | Status: DC | PRN

## 2014-03-02 NOTE — Assessment & Plan Note (Signed)
Maj. depression noted she's doing better on the Brintillex and Abilify combination. I will go ahead and send her to psychiatry to see what their thoughts are regarding her medications. Her mother does have a history of bipolar disorder and was also on Abilify. She's already tried Lexapro and Effexor but these did not keep her mood steady.

## 2014-03-02 NOTE — Assessment & Plan Note (Signed)
Her labs are all normal. I think that this is most likely related to the anxiety I will hold on getting the MRI of the brain c psychiatry first they think it may be something else going on there we will proceed with this

## 2014-03-02 NOTE — Progress Notes (Signed)
Patient ID: Kristine Ortega, female   DOB: 12-02-1965, 48 y.o.   MRN: [id number]   Subjective:    Patient ID: Kristine Ortega, female    DOB: 07/21/1966, 48 y.o.   MRN: [id number]  Patient presents for 2 week F/U  is here to followup medications. Her last visit she was on Brintillex, this is doing very well for her depression but she continues to have anxiety and is afraid to be at home by herself her tremors also persist she has a friend with her today states that she just looks like she can't concentrate on any past and that she gets very overwhelmed and that start her cycle of anxiety. She takes the hydroxyzine which helps as well as Xanax in small doses but it wears off and then the cycle returns. She still sleeping fairly well and denies any thoughts of suicidal ideation denies any hallucinations but she is concerned that she's not bouncing back to her normal self. Her labs were fairly unremarkable.    Review Of Systems:  GEN- denies fatigue, fever, weight loss,weakness, recent illness HEENT- denies eye drainage, change in vision, nasal discharge, CVS- denies chest pain, palpitations RESP- denies SOB, cough, wheeze ABD- denies N/V, change in stools, abd pain Neuro- denies headache, dizziness, syncope, seizure activity       Objective:    BP 118/68  Pulse 64  Temp(Src) 98 F (36.7 C) (Oral)  Resp 16  Ht 5\' 3"  (1.6 m)  Wt 155 lb (70.308 kg)  BMI 27.46 kg/m2 GEN- NAD, alert and oriented x3 Psych- depressed affect, a little anxious appearing, shaking of both hands R >l well groomed, good eye contact, no SI, normal speech        Assessment & Plan:      Problem List Items Addressed This Visit   None      Note: This dictation was prepared with Dragon dictation along with smaller phrase technology. Any transcriptional errors that result from this process are unintentional.

## 2014-03-02 NOTE — Patient Instructions (Signed)
Referral to psychiatry for medications

## 2014-03-02 NOTE — Assessment & Plan Note (Signed)
Will refer to psychiatry for further evaluation I will hold on MRI of the brain as I think that the tremor is most likely related to her mood

## 2014-03-12 ENCOUNTER — Telehealth: Payer: Self-pay | Admitting: *Deleted

## 2014-03-12 MED ORDER — SERTRALINE HCL 50 MG PO TABS: 100.0000 mg | ORAL_TABLET | Freq: Every day | ORAL | Status: DC

## 2014-03-12 NOTE — Telephone Encounter (Signed)
Send in zoloft 50mg  1 po daily x 2 weeks, then increase to 100mg  once a day   Send script as zoloft 50mg , take 2 tablets daily #60 R 2  For Brintillex, decrease to 1/2 tablet for 1 week then stop   Have Sabrina call [facility] and see if we can get an earlier spot with psychiatrist for medications

## 2014-03-12 NOTE — Telephone Encounter (Signed)
Called [facility] but stated can not see pt until August but has her on a cancelling list if someone calls they will contact her will sooner appt becomes available.

## 2014-03-12 NOTE — Telephone Encounter (Signed)
Also reports that she called [facility] but could not get an appointment until Aug 2015.

## 2014-03-12 NOTE — Telephone Encounter (Signed)
Call returned to patient.   Reports that she continues to have increased anxiety. States that she is still shaky and feels nervous. Reports that she continues to feel that she is not focused and her emotions have shut down. States that she is sleeping better now, but thinks that Brintellix is not working as well as it should.   Reports that other members of her family, primarily her mother, have had success with Zoloft. Requested to change over to Zoloft to see if this will be more effective.   MD please advise.

## 2014-03-12 NOTE — Telephone Encounter (Signed)
Prescription sent to pharmacy. .   Call placed to patient and patient made aware.  

## 2014-03-12 NOTE — Telephone Encounter (Signed)
Message copied by Sheral Flow on Mon Mar 12, 2014  8:40 AM ------      Message from: Lenore Manner      Created: Mon Mar 12, 2014  8:00 AM      Regarding: Med      Contact: [phone]       Pt is calling to speak to you about being put on zoloft instead of what she was put on.  ------

## 2014-03-16 ENCOUNTER — Other Ambulatory Visit: Payer: BC Managed Care – PPO

## 2014-03-16 ENCOUNTER — Telehealth: Payer: Self-pay | Admitting: *Deleted

## 2014-03-16 DIAGNOSIS — Z79899 Other long term (current) drug therapy: Secondary | ICD-10-CM

## 2014-03-16 NOTE — Telephone Encounter (Signed)
MD made aware and approved orders.   Labs obtained.   Fax results to [facility] [phone].

## 2014-03-16 NOTE — Telephone Encounter (Signed)
Received call from patient.   Reports that she went to appointment with Lajuana Ripple, NP at [facility]. States that NP gave her prescription to have labs drawn and she wanted to know if she could have the labs drawn here.   Advised to come in with prescription and we would fax results to [facility].

## 2014-03-17 LAB — FERRITIN: FERRITIN: 82 ng/mL (ref 10–291)

## 2014-03-17 LAB — T3, FREE: T3, Free: 2.8 pg/mL (ref 2.3–4.2)

## 2014-03-17 LAB — FOLATE

## 2014-03-17 LAB — T4, FREE: Free T4: 1.39 ng/dL (ref 0.80–1.80)

## 2014-04-14 ENCOUNTER — Emergency Department (HOSPITAL_COMMUNITY): Payer: BC Managed Care – PPO

## 2014-04-14 ENCOUNTER — Emergency Department (HOSPITAL_COMMUNITY)
Admission: EM | Admit: 2014-04-14 | Discharge: 2014-04-14 | Disposition: A | Discharge status: Home / Self Care | Payer: BC Managed Care – PPO | Attending: Emergency Medicine | Admitting: Emergency Medicine

## 2014-04-14 ENCOUNTER — Encounter (HOSPITAL_COMMUNITY): Payer: Self-pay | Admitting: Emergency Medicine

## 2014-04-14 DIAGNOSIS — F411 Generalized anxiety disorder: Secondary | ICD-10-CM | POA: Insufficient documentation

## 2014-04-14 DIAGNOSIS — E860 Dehydration: Secondary | ICD-10-CM | POA: Insufficient documentation

## 2014-04-14 DIAGNOSIS — R5383 Other fatigue: Secondary | ICD-10-CM

## 2014-04-14 DIAGNOSIS — R5381 Other malaise: Secondary | ICD-10-CM | POA: Insufficient documentation

## 2014-04-14 DIAGNOSIS — Z853 Personal history of malignant neoplasm of breast: Secondary | ICD-10-CM | POA: Insufficient documentation

## 2014-04-14 DIAGNOSIS — Z79899 Other long term (current) drug therapy: Secondary | ICD-10-CM | POA: Insufficient documentation

## 2014-04-14 HISTORY — DX: Anxiety disorder, unspecified: F41.9

## 2014-04-14 LAB — CBC WITH DIFFERENTIAL/PLATELET
BASOS PCT: 0 % (ref 0–1)
Basophils Absolute: 0 10*3/uL (ref 0.0–0.1)
Eosinophils Absolute: 0 10*3/uL (ref 0.0–0.7)
Eosinophils Relative: 0 % (ref 0–5)
HCT: 46.1 % — ABNORMAL HIGH (ref 36.0–46.0)
Hemoglobin: 15.9 g/dL — ABNORMAL HIGH (ref 12.0–15.0)
Lymphocytes Relative: 18 % (ref 12–46)
Lymphs Abs: 1.3 10*3/uL (ref 0.7–4.0)
MCH: 28.3 pg (ref 26.0–34.0)
MCHC: 34.5 g/dL (ref 30.0–36.0)
MCV: 82 fL (ref 78.0–100.0)
Monocytes Absolute: 0.4 10*3/uL (ref 0.1–1.0)
Monocytes Relative: 6 % (ref 3–12)
NEUTROS ABS: 5.4 10*3/uL (ref 1.7–7.7)
NEUTROS PCT: 76 % (ref 43–77)
Platelets: 248 10*3/uL (ref 150–400)
RBC: 5.62 MIL/uL — AB (ref 3.87–5.11)
RDW: 12.3 % (ref 11.5–15.5)
WBC: 7.1 10*3/uL (ref 4.0–10.5)

## 2014-04-14 LAB — BASIC METABOLIC PANEL
Anion gap: 18 — ABNORMAL HIGH (ref 5–15)
BUN: 10 mg/dL (ref 6–23)
CO2: 22 mEq/L (ref 19–32)
Calcium: 10.3 mg/dL (ref 8.4–10.5)
Chloride: 104 mEq/L (ref 96–112)
Creatinine, Ser: 0.82 mg/dL (ref 0.50–1.10)
GFR calc non Af Amer: 83 mL/min — ABNORMAL LOW (ref >90)
Glucose, Bld: 88 mg/dL (ref 70–99)
POTASSIUM: 4 meq/L (ref 3.7–5.3)
SODIUM: 144 meq/L (ref 137–147)

## 2014-04-14 MED ORDER — ONDANSETRON HCL 8 MG PO TABS: 8.0000 mg | ORAL_TABLET | ORAL | Status: DC | PRN

## 2014-04-14 MED ORDER — SODIUM CHLORIDE 0.9 % IV BOLUS (SEPSIS)
1000.0000 mL | Freq: Once | INTRAVENOUS | Status: AC
  Administered 2014-04-14: 1000 mL via INTRAVENOUS

## 2014-04-14 NOTE — ED Provider Notes (Signed)
CSN: [id number]     Arrival date & time 04/14/14  1227 History  This chart was scribed for Kristine Christen, MD by Ludger Nutting, ED Scribe. This patient was seen in room APA17/APA17 and the patient's care was started 12:50 PM.    Chief Complaint  Patient presents with  . Weakness  . Anxiety    The history is provided by the patient. No language interpreter was used.    HPI Comments: Kristine Ortega is a 48 y.o. female with past medical history of anxiety who presents to the Emergency Department complaining of anxiety and decreased appetite for the past several weeks. Patient states each time she consumes anything, she becomes nauseated. She states her anxiety has worsened over the last few days and reports an associated shake to the right arm. Patient states this is not new for her and has experienced this with increased anxiety in the past. She states her PCP has treated her for anxiety with 3 different anti-depressants without relief. She was referred to [facility] which is prescribing her Zoloft and Lamictal. She reports taking Xanax as needed.   Past Medical History  Diagnosis Date  . Cancer     breast cancer  . Headache(784.0)   . Anxiety    Past Surgical History  Procedure Laterality Date  . Tonsillectomy    . Abdominal hysterectomy  2007    BSO  . Mastectomy w/ sentinel node biopsy  2005    left-  . Simple mastectomy  2005    right-prophalactic  . Breast reconstruction  2005    implants-bilat  . Port-a-cath removal  2007    insertion then out  . Cesarean section      x4  . Carpal tunnel release Right 05/16/2013    Procedure: CARPAL TUNNEL RELEASE RIGHT ;  Surgeon: Cammie Sickle., MD;  Location: [place];  Service: Orthopedics;  Laterality: Right;  . Carpal tunnel release Left 08/08/2013    Procedure: LEFT CARPAL TUNNEL RELEASE;  Surgeon: Cammie Sickle., MD;  Location: [place];  Service: Orthopedics;  Laterality: Left;    Family History  Problem Relation Age of Onset  . Diabetes Mother   . Hypertension Mother   . Cancer Father     prostate  . Stroke Father    History  Substance Use Topics  . Smoking status: Never Smoker   . Smokeless tobacco: Never Used  . Alcohol Use: No   OB History   Grav Para Term Preterm Abortions TAB SAB Ect Mult Living                 Review of Systems  A complete 10 system review of systems was obtained and all systems are negative except as noted in the HPI and PMH.    Allergies  Review of patient's allergies indicates no known allergies.  Home Medications   Prior to Admission medications   Medication Sig Start Date End Date Taking? Authorizing Provider  ALPRAZolam Duanne Moron) 1 MG tablet Take 1 tablet (1 mg total) by mouth 3 (three) times daily as needed for anxiety. 12/04/13  Yes Alycia Rossetti, MD  lamoTRIgine (LAMICTAL) 25 MG tablet Take 25 mg by mouth daily.   Yes Historical Provider, MD  sertraline (ZOLOFT) 50 MG tablet Take 2 tablets (100 mg total) by mouth daily. 03/12/14  Yes Alycia Rossetti, MD  SUMAtriptan (IMITREX) 100 MG tablet Take 1 tablet (100 mg total) by mouth as  needed. 07/25/13  Yes Alycia Rossetti, MD  ondansetron (ZOFRAN) 8 MG tablet Take 1 tablet (8 mg total) by mouth every 4 (four) hours as needed. 04/14/14   Kristine Christen, MD   BP 126/85  Pulse 58  Temp(Src) 99 F (37.2 C) (Oral)  Resp 20  Ht 5\' 3"  (1.6 m)  Wt 145 lb (65.772 kg)  BMI 25.69 kg/m2  SpO2 100% Physical Exam  Nursing note and vitals reviewed. Constitutional: She is oriented to person, place, and time. She appears well-developed and well-nourished.  HENT:  Head: Normocephalic and atraumatic.  Eyes: Conjunctivae and EOM are normal. Pupils are equal, round, and reactive to light.  Neck: Normal range of motion. Neck supple.  Cardiovascular: Normal rate, regular rhythm and normal heart sounds.   Pulmonary/Chest: Effort normal and breath sounds normal.  Abdominal: Soft. Bowel  sounds are normal.  Musculoskeletal: Normal range of motion.  Neurological: She is alert and oriented to person, place, and time.  Right arm rhythmically moving and shaking.   Skin: Skin is warm and dry.  Psychiatric: Her mood appears anxious. She is agitated.  Agitated, restless, nervous, anxious     ED Course  Procedures (including critical care time)  DIAGNOSTIC STUDIES: Oxygen Saturation is 100% on RA, normal by my interpretation.    COORDINATION OF CARE: 12:57 PM Will give IV fluids and order blood work. Discussed treatment plan with pt at bedside and pt agreed to plan.   Labs Review Labs Reviewed  CBC WITH DIFFERENTIAL - Abnormal; Notable for the following:    RBC 5.62 (*)    Hemoglobin 15.9 (*)    HCT 46.1 (*)    All other components within normal limits  BASIC METABOLIC PANEL - Abnormal; Notable for the following:    GFR calc non Af Amer 83 (*)    Anion gap 18 (*)    All other components within normal limits    Imaging Review Ct Head Wo Contrast  04/14/2014   CLINICAL DATA:  Tonic-clonic movement.  EXAM: CT HEAD WITHOUT CONTRAST  TECHNIQUE: Contiguous axial images were obtained from the base of the skull through the vertex without contrast.  COMPARISON:  07/05/2003  FINDINGS: No evidence for acute hemorrhage, mass lesion, midline shift, hydrocephalus or large infarct. There may be postsurgical changes along the right mastoid bone. Visualized paranasal sinuses and mastoid air cells are clear. No acute bone abnormality.  IMPRESSION: No acute intracranial abnormality.   Electronically Signed   By: Markus Daft M.D.   On: 04/14/2014 15:11     EKG Interpretation None      MDM   Final diagnoses:  Dehydration    Patient feels better after 2 L of IV fluid. CT head shows no acute findings. CBC and BMET are normal.  Recommend followup with neurology and psychiatry.  I personally performed the services described in this documentation, which was scribed in my presence. The  recorded information has been reviewed and is accurate.   Kristine Christen, MD 04/14/14 1536

## 2014-04-14 NOTE — ED Notes (Signed)
Pt presents to er for evaluation of increase in anxiety that became worse after she moved to a new residence and has not been eating or drinking well, pt reports that every time she will try to eat or drink she will feel nauseous, pt alert, able to answer questions, visible shaking in triage,

## 2014-04-14 NOTE — Discharge Instructions (Signed)
Blood work and CT head were normal. Recommend followup with neurologist. Phone number given. Also recommend psychiatrist after neurology consultation.  Prescription for nausea medication

## 2014-04-16 ENCOUNTER — Telehealth: Payer: Self-pay | Admitting: Family Medicine

## 2014-04-16 NOTE — Telephone Encounter (Signed)
I spoke with patient to followup her ER visit she was having what sounds like an extreme anxiety or panic attack. She's also dehydrated she's not been eating and drinking as well. She is following with [facility] counseling and was started on Lamictal recently her Zoloft was also increased to 100 mg per she's not seen any significant benefit. She follows up with him on Thursday. Advise her to take a half a tablet of Xanax in the morning and half a tablet in the evening as this is the only thing that helps calm her nerves. If they feel like her tremor or and shaking is out of proportion to her evaluation/anxiety and I will get her set up with neurology of note her CT of brain was normal

## 2014-04-21 ENCOUNTER — Encounter (HOSPITAL_COMMUNITY): Payer: Self-pay | Admitting: Emergency Medicine

## 2014-04-21 ENCOUNTER — Emergency Department (HOSPITAL_COMMUNITY)
Admission: EM | Admit: 2014-04-21 | Discharge: 2014-04-21 | Disposition: A | Discharge status: Home / Self Care | Payer: BC Managed Care – PPO | Attending: Emergency Medicine | Admitting: Emergency Medicine

## 2014-04-21 DIAGNOSIS — F32A Depression, unspecified: Secondary | ICD-10-CM

## 2014-04-21 DIAGNOSIS — F419 Anxiety disorder, unspecified: Secondary | ICD-10-CM

## 2014-04-21 DIAGNOSIS — F411 Generalized anxiety disorder: Secondary | ICD-10-CM | POA: Insufficient documentation

## 2014-04-21 DIAGNOSIS — F341 Dysthymic disorder: Secondary | ICD-10-CM | POA: Insufficient documentation

## 2014-04-21 DIAGNOSIS — F329 Major depressive disorder, single episode, unspecified: Secondary | ICD-10-CM

## 2014-04-21 LAB — CBC WITH DIFFERENTIAL/PLATELET
BASOS PCT: 0 % (ref 0–1)
Basophils Absolute: 0 10*3/uL (ref 0.0–0.1)
EOS ABS: 0 10*3/uL (ref 0.0–0.7)
Eosinophils Relative: 0 % (ref 0–5)
HEMATOCRIT: 41.4 % (ref 36.0–46.0)
Hemoglobin: 13.6 g/dL (ref 12.0–15.0)
Lymphocytes Relative: 20 % (ref 12–46)
Lymphs Abs: 1 10*3/uL (ref 0.7–4.0)
MCH: 27.5 pg (ref 26.0–34.0)
MCHC: 32.9 g/dL (ref 30.0–36.0)
MCV: 83.6 fL (ref 78.0–100.0)
MONOS PCT: 7 % (ref 3–12)
Monocytes Absolute: 0.4 10*3/uL (ref 0.1–1.0)
Neutro Abs: 3.8 10*3/uL (ref 1.7–7.7)
Neutrophils Relative %: 73 % (ref 43–77)
Platelets: 228 10*3/uL (ref 150–400)
RBC: 4.95 MIL/uL (ref 3.87–5.11)
RDW: 12.5 % (ref 11.5–15.5)
WBC: 5.3 10*3/uL (ref 4.0–10.5)

## 2014-04-21 LAB — URINALYSIS, ROUTINE W REFLEX MICROSCOPIC
BILIRUBIN URINE: NEGATIVE
Glucose, UA: NEGATIVE mg/dL
HGB URINE DIPSTICK: NEGATIVE
Ketones, ur: NEGATIVE mg/dL
Nitrite: NEGATIVE
PROTEIN: NEGATIVE mg/dL
Specific Gravity, Urine: 1.016 (ref 1.005–1.030)
Urobilinogen, UA: 0.2 mg/dL (ref 0.0–1.0)
pH: 6 (ref 5.0–8.0)

## 2014-04-21 LAB — URINE MICROSCOPIC-ADD ON

## 2014-04-21 LAB — BASIC METABOLIC PANEL
Anion gap: 11 (ref 5–15)
BUN: 11 mg/dL (ref 6–23)
CALCIUM: 9.7 mg/dL (ref 8.4–10.5)
CO2: 27 mEq/L (ref 19–32)
Chloride: 105 mEq/L (ref 96–112)
Creatinine, Ser: 0.82 mg/dL (ref 0.50–1.10)
GFR calc Af Amer: >90 mL/min (ref >90)
GFR, EST NON AFRICAN AMERICAN: 83 mL/min — AB (ref >90)
Glucose, Bld: 93 mg/dL (ref 70–99)
Potassium: 4.7 mEq/L (ref 3.7–5.3)
Sodium: 143 mEq/L (ref 137–147)

## 2014-04-21 LAB — ACETAMINOPHEN LEVEL

## 2014-04-21 LAB — RAPID URINE DRUG SCREEN, HOSP PERFORMED
Amphetamines: NOT DETECTED
BARBITURATES: NOT DETECTED
Benzodiazepines: POSITIVE — AB
COCAINE: NOT DETECTED
Opiates: NOT DETECTED
TETRAHYDROCANNABINOL: NOT DETECTED

## 2014-04-21 LAB — POC URINE PREG, ED: Preg Test, Ur: NEGATIVE

## 2014-04-21 LAB — SALICYLATE LEVEL: Salicylate Lvl: <2 mg/dL — ABNORMAL LOW (ref 2.8–20.0)

## 2014-04-21 LAB — ETHANOL: Alcohol, Ethyl (B): <11 mg/dL (ref 0–11)

## 2014-04-21 MED ORDER — IBUPROFEN 200 MG PO TABS: 600.0000 mg | ORAL_TABLET | Freq: Three times a day (TID) | ORAL | Status: DC | PRN

## 2014-04-21 MED ORDER — ONDANSETRON HCL 4 MG PO TABS: 4.0000 mg | ORAL_TABLET | Freq: Three times a day (TID) | ORAL | Status: DC | PRN

## 2014-04-21 MED ORDER — ACETAMINOPHEN 325 MG PO TABS: 650.0000 mg | ORAL_TABLET | ORAL | Status: DC | PRN

## 2014-04-21 MED ORDER — LORAZEPAM 2 MG/ML IJ SOLN
1.0000 mg | Freq: Once | INTRAMUSCULAR | Status: AC
  Administered 2014-04-21: 1 mg via INTRAMUSCULAR
  Filled 2014-04-21: qty 1

## 2014-04-21 MED ORDER — ZOLPIDEM TARTRATE 5 MG PO TABS: 5.0000 mg | ORAL_TABLET | Freq: Every evening | ORAL | Status: DC | PRN

## 2014-04-21 MED ORDER — LORAZEPAM 1 MG PO TABS
1.0000 mg | ORAL_TABLET | Freq: Three times a day (TID) | ORAL | Status: DC | PRN
  Administered 2014-04-21: 1 mg via ORAL
  Filled 2014-04-21: qty 1

## 2014-04-21 MED ORDER — LAMOTRIGINE 25 MG PO TABS
25.0000 mg | ORAL_TABLET | Freq: Every day | ORAL | Status: DC
  Administered 2014-04-21: 25 mg via ORAL
  Filled 2014-04-21: qty 1

## 2014-04-21 MED ORDER — SERTRALINE HCL 50 MG PO TABS
100.0000 mg | ORAL_TABLET | Freq: Every day | ORAL | Status: DC
  Administered 2014-04-21: 100 mg via ORAL
  Filled 2014-04-21: qty 2

## 2014-04-21 NOTE — Discharge Instructions (Signed)

## 2014-04-21 NOTE — ED Notes (Signed)
Pt placed in paper scrubs.  Belongings given to family. Security paged to wand pt.

## 2014-04-21 NOTE — ED Notes (Signed)
Pt wanded by security. 

## 2014-04-21 NOTE — ED Provider Notes (Signed)
Seen by me 5:20 PM  Transferred from [facility] ED .patient reports long-standing anxiety since beginning of 2015. She denies suicidal or homicidal ideation or wanting to harm herself or others. She appears anxious and frustrated alert Glasgow Coma Score 15. Presently tearful Results for orders placed during the hospital encounter of 04/21/14  CBC WITH DIFFERENTIAL      Result Value Ref Range   WBC 5.3  4.0 - 10.5 K/uL   RBC 4.95  3.87 - 5.11 MIL/uL   Hemoglobin 13.6  12.0 - 15.0 g/dL   HCT 41.4  36.0 - 46.0 %   MCV 83.6  78.0 - 100.0 fL   MCH 27.5  26.0 - 34.0 pg   MCHC 32.9  30.0 - 36.0 g/dL   RDW 12.5  11.5 - 15.5 %   Platelets 228  150 - 400 K/uL   Neutrophils Relative % 73  43 - 77 %   Neutro Abs 3.8  1.7 - 7.7 K/uL   Lymphocytes Relative 20  12 - 46 %   Lymphs Abs 1.0  0.7 - 4.0 K/uL   Monocytes Relative 7  3 - 12 %   Monocytes Absolute 0.4  0.1 - 1.0 K/uL   Eosinophils Relative 0  0 - 5 %   Eosinophils Absolute 0.0  0.0 - 0.7 K/uL   Basophils Relative 0  0 - 1 %   Basophils Absolute 0.0  0.0 - 0.1 K/uL  BASIC METABOLIC PANEL      Result Value Ref Range   Sodium 143  137 - 147 mEq/L   Potassium 4.7  3.7 - 5.3 mEq/L   Chloride 105  96 - 112 mEq/L   CO2 27  19 - 32 mEq/L   Glucose, Bld 93  70 - 99 mg/dL   BUN 11  6 - 23 mg/dL   Creatinine, Ser 0.82  0.50 - 1.10 mg/dL   Calcium 9.7  8.4 - 10.5 mg/dL   GFR calc non Af Amer 83 (*) >90 mL/min   GFR calc Af Amer >90  >90 mL/min   Anion gap 11  5 - 15  URINALYSIS, ROUTINE W REFLEX MICROSCOPIC      Result Value Ref Range   Color, Urine YELLOW  YELLOW   APPearance HAZY (*) CLEAR   Specific Gravity, Urine 1.016  1.005 - 1.030   pH 6.0  5.0 - 8.0   Glucose, UA NEGATIVE  NEGATIVE mg/dL   Hgb urine dipstick NEGATIVE  NEGATIVE   Bilirubin Urine NEGATIVE  NEGATIVE   Ketones, ur NEGATIVE  NEGATIVE mg/dL   Protein, ur NEGATIVE  NEGATIVE mg/dL   Urobilinogen, UA 0.2  0.0 - 1.0 mg/dL   Nitrite NEGATIVE  NEGATIVE   Leukocytes, UA  SMALL (*) NEGATIVE  URINE RAPID DRUG SCREEN (HOSP PERFORMED)      Result Value Ref Range   Opiates NONE DETECTED  NONE DETECTED   Cocaine NONE DETECTED  NONE DETECTED   Benzodiazepines POSITIVE (*) NONE DETECTED   Amphetamines NONE DETECTED  NONE DETECTED   Tetrahydrocannabinol NONE DETECTED  NONE DETECTED   Barbiturates NONE DETECTED  NONE DETECTED  SALICYLATE LEVEL      Result Value Ref Range   Salicylate Lvl <8.8 (*) 2.8 - 20.0 mg/dL  ACETAMINOPHEN LEVEL      Result Value Ref Range   Acetaminophen (Tylenol), Serum <15.0  10 - 30 ug/mL  ETHANOL      Result Value Ref Range   Alcohol, Ethyl (B) <11  0 - 11 mg/dL  URINE MICROSCOPIC-ADD ON      Result Value Ref Range   Squamous Epithelial / LPF FEW (*) RARE   WBC, UA 3-6  <3 WBC/hpf   Bacteria, UA FEW (*) RARE   Urine-Other MUCOUS PRESENT    POC URINE PREG, ED      Result Value Ref Range   Preg Test, Ur NEGATIVE  NEGATIVE   Ct Head Wo Contrast  04/14/2014   CLINICAL DATA:  Tonic-clonic movement.  EXAM: CT HEAD WITHOUT CONTRAST  TECHNIQUE: Contiguous axial images were obtained from the base of the skull through the vertex without contrast.  COMPARISON:  07/05/2003  FINDINGS: No evidence for acute hemorrhage, mass lesion, midline shift, hydrocephalus or large infarct. There may be postsurgical changes along the right mastoid bone. Visualized paranasal sinuses and mastoid air cells are clear. No acute bone abnormality.  IMPRESSION: No acute intracranial abnormality.   Electronically Signed   By: Markus Daft M.D.   On: 04/14/2014 15:11     Orlie Dakin, MD 04/21/14 1726

## 2014-04-21 NOTE — BH Assessment (Signed)
Tele Assessment Note   Kristine Ortega is an 48 y.o. female that was assessed this day after a tele assessment ordered by Vivi Barrack, PA-C and clinical information gathered on the pt.  Pt seen for tele assessment by this clinician @ 1105.  Pt presents with her mother and her husband.  Both are present during assessment per pt request.  Pt's family is concerned about the pt because she has not been eating (has lost 30 lbs in 5 mos), had to go to ED last week at [facility] to get fluids because of dehydration per family, is not sleeping, is having panic attacks, is "uncontrollable" and cannot be consoled at times, is not sleeping, doesn't want to be alone, is not taking care of herself (bathing, grooming regularly).  During assessment, pt was cooperative, had depressed and anxious mood, flat affect, good eye contact, logical/coherent thought processes and soft speech.  Pt denies SI or history of depression or anxiety.  She has had no previous treatment until she began to feel depressed at the beginning of the year after her last two children left home.  Then, she and her husband moved away from the home they have lived in for over 30 years to their second home, a cabin.  Pt then began to become increasingly more depressed and anxious.  Her husband has since moved with her back to the original home.  Pt has been seeing her PCP and has tried several antidepressants that she reported haven't worked - Celexa, Lexapro, Brintellix, and has now been taking Zoloft for one month.  She also takes Xanax and was given a trial of Seroquel, but she only took one dose the night before last, stating she didn't want to take it again because it made her feel "loopy" the next day.  Lastly, she is prescribed Lamictal that she has been on for one month.  Pt's husband gave her 2 Xanax this AM to calm the pt before bringing her to the ED.  Pt stated she has only suffered once from anxiety after having breast cancer 10 years ago.  Pt is  motivated for inpatient treatment and her family is also in agreement.  Pt denies HI, psychosis, or SA.  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  Her husband is about to retire.  Her family is very supportive.  Consulted with Charmaine Downs, NP, who recommended inpatient treatment for the pt @1135 .  PA Forucci, who was in agreement with pt disposition.  TTS to seek placement elsewhere, as there are no beds at [facility] and pt meets inpatient criteria.  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 Cancer     breast cancer  . Headache(784.0)   . Anxiety    Axis IV: other psychosocial or environmental problems Axis V: 11-20 some danger of hurting self or others possible OR occasionally fails to maintain minimal personal hygiene OR gross impairment in communication  Past Medical History:  Past Medical History  Diagnosis Date  . Cancer     breast cancer  . Headache(784.0)   . Anxiety     Past Surgical History  Procedure Laterality Date  . Tonsillectomy    . Abdominal hysterectomy  2007    BSO  . Mastectomy w/ sentinel node biopsy  2005    left-  . Simple mastectomy  2005  right-prophalactic  . Breast reconstruction  2005    implants-bilat  . Port-a-cath removal  2007    insertion then out  . Cesarean section      x4  . Carpal tunnel release Right 05/16/2013    Procedure: CARPAL TUNNEL RELEASE RIGHT ;  Surgeon: Cammie Sickle., MD;  Location: [place];  Service: Orthopedics;  Laterality: Right;  . Carpal tunnel release Left 08/08/2013    Procedure: LEFT CARPAL TUNNEL RELEASE;  Surgeon: Cammie Sickle., MD;  Location: [place];  Service: Orthopedics;  Laterality: Left;    Family History:  Family History  Problem Relation Age of Onset  . Diabetes Mother   . Hypertension Mother   . Cancer Father      prostate  . Stroke Father     Social History:  reports that she has never smoked. She has never used smokeless tobacco. She reports that she does not drink alcohol or use illicit drugs.  Additional Social History:  Alcohol / Drug Use Pain Medications: see med list Prescriptions: see med list Over the Counter: see med list History of alcohol / drug use?: No history of alcohol / drug abuse Longest period of sobriety (when/how long):  (na) Negative Consequences of Use:  (na) Withdrawal Symptoms:  (na)  CIWA: CIWA-Ar BP: 119/78 mmHg Pulse Rate: 72 COWS:    PATIENT STRENGTHS: (choose at least two) Ability for insight Average or above average intelligence Capable of independent living Occupational psychologist fund of knowledge Motivation for treatment/growth Supportive family/friends Work skills  Allergies: No Known Allergies  Home Medications:  (Not in a hospital admission)  OB/GYN Status:  No LMP recorded. Patient has had a hysterectomy.  General Assessment Data Location of Assessment: [facility] ED Is this a Tele or Face-to-Face Assessment?: Tele Assessment Is this an Initial Assessment or a Re-assessment for this encounter?: Initial Assessment Living Arrangements: Spouse/significant other Can pt return to current living arrangement?: Yes Admission Status: Voluntary Is patient capable of signing voluntary admission?: Yes Transfer from: [facility] Referral Source: Self/Family/Friend     [place] Living Arrangements: Spouse/significant other Name of Psychiatrist: [place] Name of Therapist: [facility]  Education Status Is patient currently in school?: No Highest grade of school patient has completed: Media planner  Risk to self with the past 6 months Suicidal Ideation: No Suicidal Intent: No Is patient at risk for suicide?: No Suicidal Plan?: No Access to Means: No What has  been your use of drugs/alcohol within the last 12 months?: na - pt denies Previous Attempts/Gestures: No How many times?: 0 Other Self Harm Risks: pt denies Triggers for Past Attempts: None known Intentional Self Injurious Behavior: None Family Suicide History: No Recent stressful life event(s): Loss (Comment);Turmoil (Comment);Other (Comment) (Recent move, children left home, depression, anxiety) Persecutory voices/beliefs?: No Depression: Yes Depression Symptoms: Despondent;Insomnia;Tearfulness;Fatigue;Loss of interest in usual pleasures;Feeling worthless/self pity Substance abuse history and/or treatment for substance abuse?: No Suicide prevention information given to non-admitted patients: Not applicable  Risk to Others within the past 6 months Homicidal Ideation: No Thoughts of Harm to Others: No Current Homicidal Intent: No Current Homicidal Plan: No Access to Homicidal Means: No Identified Victim: na History of harm to others?: No Assessment of Violence: None Noted Violent Behavior Description: na - pt aclm, cooperative Does patient have access to weapons?: No Criminal Charges Pending?: No Does patient have a court date: No  Psychosis Hallucinations: None  noted Delusions: None noted  Mental Status Report Appear/Hygiene: Disheveled Eye Contact: Good Motor Activity: Freedom of movement;Unremarkable Speech: Logical/coherent;Soft Level of Consciousness: Quiet/awake Mood: Depressed;Anxious Affect: Depressed;Anxious Anxiety Level: Panic Attacks Panic attack frequency: every other day Most recent panic attack: today Thought Processes: Coherent;Relevant Judgement: Unimpaired Orientation: Person;Place;Time;Situation Obsessive Compulsive Thoughts/Behaviors: None  Cognitive Functioning Concentration: Decreased Memory: Recent Intact;Remote Intact IQ: Average Insight: Poor Impulse Control: Fair Appetite: Poor Weight Loss: 30 (in 5 months) Weight Gain: 0 Sleep:  Decreased Total Hours of Sleep: 2 Vegetative Symptoms: Staying in bed;Not bathing;Decreased grooming  ADLScreening [facility] Assessment Services) Patient's cognitive ability adequate to safely complete daily activities?: Yes Patient able to express need for assistance with ADLs?: Yes Independently performs ADLs?: Yes (appropriate for developmental age)  Prior Inpatient Therapy Prior Inpatient Therapy: No Prior Therapy Dates: na Prior Therapy Facilty/Provider(s): na Reason for Treatment: na  Prior Outpatient Therapy Prior Outpatient Therapy: Yes Prior Therapy Dates: Current  Prior Therapy Facilty/Provider(s): [facility] Reason for Treatment: Depression/Anxiety  ADL Screening (condition at time of admission) Patient's cognitive ability adequate to safely complete daily activities?: Yes Is the patient deaf or have difficulty hearing?: No Does the patient have difficulty seeing, even when wearing glasses/contacts?: No Does the patient have difficulty concentrating, remembering, or making decisions?: No Patient able to express need for assistance with ADLs?: Yes Does the patient have difficulty dressing or bathing?: No Independently performs ADLs?: Yes (appropriate for developmental age) Does the patient have difficulty walking or climbing stairs?: No  Home Assistive Devices/Equipment Home Assistive Devices/Equipment: None    Abuse/Neglect Assessment (Assessment to be complete while patient is alone) Physical Abuse: Denies Verbal Abuse: Denies Sexual Abuse: Denies Exploitation of patient/patient's resources: Denies Self-Neglect: Denies Values / Beliefs Cultural Requests During Hospitalization: None Spiritual Requests During Hospitalization: None Consults Spiritual Care Consult Needed: No Social Work Consult Needed: No Regulatory affairs officer (For Healthcare) Does patient have an advance directive?: No Would patient like information on creating an advanced directive?: No  - patient declined information    Additional Information 1:1 In Past 12 Months?: No CIRT Risk: No Elopement Risk: No Does patient have medical clearance?: Yes     Disposition:  Disposition Initial Assessment Completed for this Encounter: Yes Disposition of Patient: Inpatient treatment program Type of inpatient treatment program: Adult (Inpatient treatment recommended)  Shaune Pascal, [place], [facility] Licensed Professional Counselor Triage Specialist  04/21/2014 11:51 AM

## 2014-04-21 NOTE — ED Notes (Signed)
Dr Jacubowitz into see 

## 2014-04-21 NOTE — Progress Notes (Addendum)
Pt has been accepted to [facility] by Dr. Mina Marble per Arbie Cookey pending IVC.  D/t pt being voluntary and no reason to IVC pt has been added to their wait list and once more beds become available can have family members bring her.  [facility] will call TTS at that time.   Rick Duff Disposition MHT

## 2014-04-21 NOTE — ED Notes (Signed)
Per pt and family pt did not sleep well last night and is very emotional today. Pt currently being treated for anxiety and depression. Pt tearful. Pt denies SI/HI. Pt just started Seroquel and doesn't like the way it makes her feel.

## 2014-04-21 NOTE — Progress Notes (Signed)
The following facilities have been contacted regarding inptx on pt's behalf:  REFERRAL FAXED: [facility]- per Hoyle Sauer having d/c's today and could fax for review [facility]- per Kasandra Knudsen can fax for review [facility]- per Amy can fax low acuity only, have 2 beds available [facility]- per Roderic Palau can fax [facility]- per Jeannetta Nap accepting referrals [facility]- per Junita Push can fax [facility]- per Arbie Cookey can fax, beds available [facility]- per Erasmo Downer adult female beds available  AT [facility]: [place]- per [facility]. "looking at their ED right now" [facility]- per [facility]- per [facility] c/a beds only available [facility]- per [facility] Disposition MHT

## 2014-04-21 NOTE — ED Notes (Signed)
[facility] here for transport. 

## 2014-04-21 NOTE — Progress Notes (Signed)
Pt has been accepted to [facility] by Dr. Sheppard Evens per Kasandra Knudsen and can come tonight.  Report number is [id number].  Will call [facility] with update.   Rick Duff Disposition MHT

## 2014-04-21 NOTE — BH Assessment (Signed)
Clinician informed Kristine Ortega of updates that patient has been accepted to [facility].    Rigoberto Noel, MSW, LCSW Triage Specialist [phone]

## 2014-04-21 NOTE — ED Provider Notes (Signed)
CSN: [id number]     Arrival date & time 04/21/14  5056 History   First MD Initiated Contact with Patient 04/21/14 [phone]     Chief Complaint  Patient presents with  . Anxiety  . Depression   Patient is a 48 y.o. female presenting with anxiety.  Anxiety    Patient is a  48 y.o. Female who presents to the ED with anxiety and depression.  Patient states that she has been having worsening problems with her anxiety which started at the beginning of this year.  Patient states that she is taking medications for her anxiety and is followed by Lajuana Ripple at [facility] but feels that she is getting no better.  Patient states that she is having panic attacks and breakdowns every day which involve some shortness of breath, numbness, tingling, and shaking of her extremities.  Patient states that her psychiatric NP told her if her anxiety continued to get worse that she would need to have inpatient behavioral health hospitalization.  Patient denies SI and HI at this time and has no history of self harm.  She does state that she has crying spells, difficulty eating and drinking, and trouble sleeping.   Patient denies fever, chills, N/V, diarrhea, constipation, melena, hematochezia, or urinary symptoms.  Patient states that her current anxiety attack started this am at 1 and tried taking two xanax with little relief.    Past Medical History  Diagnosis Date  . Cancer     breast cancer  . Headache(784.0)   . Anxiety    Past Surgical History  Procedure Laterality Date  . Tonsillectomy    . Abdominal hysterectomy  2007    BSO  . Mastectomy w/ sentinel node biopsy  2005    left-  . Simple mastectomy  2005    right-prophalactic  . Breast reconstruction  2005    implants-bilat  . Port-a-cath removal  2007    insertion then out  . Cesarean section      x4  . Carpal tunnel release Right 05/16/2013    Procedure: CARPAL TUNNEL RELEASE RIGHT ;  Surgeon: Cammie Sickle., MD;  Location: [place];  Service: Orthopedics;  Laterality: Right;  . Carpal tunnel release Left 08/08/2013    Procedure: LEFT CARPAL TUNNEL RELEASE;  Surgeon: Cammie Sickle., MD;  Location: [place];  Service: Orthopedics;  Laterality: Left;   Family History  Problem Relation Age of Onset  . Diabetes Mother   . Hypertension Mother   . Cancer Father     prostate  . Stroke Father    History  Substance Use Topics  . Smoking status: Never Smoker   . Smokeless tobacco: Never Used  . Alcohol Use: No   OB History   Grav Para Term Preterm Abortions TAB SAB Ect Mult Living                 Review of Systems See HPI   Allergies  Review of patient's allergies indicates no known allergies.  Home Medications   Prior to Admission medications   Medication Sig Start Date End Date Taking? Authorizing Provider  ALPRAZolam Duanne Moron) 1 MG tablet Take 1 tablet (1 mg total) by mouth 3 (three) times daily as needed for anxiety. 12/04/13  Yes Alycia Rossetti, MD  lamoTRIgine (LAMICTAL) 25 MG tablet Take 25 mg by mouth daily.   Yes Historical Provider, MD  ondansetron (ZOFRAN) 8 MG tablet Take 1 tablet (8  mg total) by mouth every 4 (four) hours as needed. 04/14/14  Yes [name], MD  QUEtiapine (SEROQUEL XR) 300 MG 24 hr tablet Take 300 mg by mouth at bedtime.   Yes Historical Provider, MD  sertraline (ZOLOFT) 50 MG tablet Take 2 tablets (100 mg total) by mouth daily. 03/12/14  Yes Alycia Rossetti, MD  SUMAtriptan (IMITREX) 100 MG tablet Take 1 tablet (100 mg total) by mouth as needed. 07/25/13  Yes Alycia Rossetti, MD   BP 119/78  Pulse 72  Temp(Src) 98.3 F (36.8 C)  Resp 18  SpO2 98% Physical Exam  Nursing note and vitals reviewed. Constitutional: She is oriented to person, place, and time. She appears well-developed and well-nourished. No distress.  HENT:  Head: Normocephalic and atraumatic.  Mouth/Throat: Oropharynx is clear and moist. No oropharyngeal exudate.  Eyes:  Conjunctivae and EOM are normal. Pupils are equal, round, and reactive to light. No scleral icterus.  Neck: Normal range of motion. Neck supple. No JVD present. No thyromegaly present.  Cardiovascular: Normal rate, regular rhythm, normal heart sounds and intact distal pulses.  Exam reveals no gallop and no friction rub.   No murmur heard. Pulmonary/Chest: Effort normal and breath sounds normal. No respiratory distress. She has no wheezes. She has no rales. She exhibits no tenderness.  Abdominal: Soft. Bowel sounds are normal. She exhibits no distension and no mass. There is no tenderness. There is no rebound and no guarding.  Musculoskeletal: Normal range of motion.  Lymphadenopathy:    She has no cervical adenopathy.  Neurological: She is alert and oriented to person, place, and time. No cranial nerve deficit. Coordination normal.  Skin: Skin is warm and dry. She is not diaphoretic.  Psychiatric: Her mood appears anxious. She is slowed. She expresses no homicidal and no suicidal ideation. She expresses no suicidal plans and no homicidal plans.  Teary upon interview, slowed speech    ED Course  Procedures (including critical care time) Labs Review Labs Reviewed  CBC WITH DIFFERENTIAL  BASIC METABOLIC PANEL  URINALYSIS, ROUTINE W REFLEX MICROSCOPIC  URINE RAPID DRUG SCREEN (HOSP PERFORMED)  SALICYLATE LEVEL  ACETAMINOPHEN LEVEL  ETHANOL  POC URINE PREG, ED    Imaging Review No results found.   EKG Interpretation None      MDM   Final diagnoses:  Anxiety  Depression   Patient is a 48 y.o female who presents to the ED with anxiety and depression. Basic labs have been performed here and the patient was given an IM injection of ativan for anxiety.  Basic labs are without any acute abnormality.  Given severity of symptoms have consulted TTS at this time who feels like the patient could benefit from an inpatient hospitalization.  There are currently no beds available at this time.   I will move the patient to pod C while I am waiting for the patient to have a disposition at [facility].  I have spoken with Dr. Dina Rich about the above patient and she agrees with the current plan.  Patient is medically cleared at this time.    Cherylann Parr, PA-C 04/21/14 1148  Cherylann Parr, PA-C 04/21/14 1514

## 2014-04-21 NOTE — ED Provider Notes (Signed)
Medical screening examination/treatment/procedure(s) were performed by non-physician practitioner and as supervising physician I was immediately available for consultation/collaboration.   EKG Interpretation None        Merryl Hacker, MD 04/21/14 [phone]

## 2014-04-21 NOTE — ED Notes (Signed)
[facility] called for transport. 

## 2014-04-25 ENCOUNTER — Telehealth: Payer: Self-pay | Admitting: Family Medicine

## 2014-04-25 MED ORDER — TRAZODONE HCL 100 MG PO TABS: 100.0000 mg | ORAL_TABLET | Freq: Every evening | ORAL | Status: DC | PRN

## 2014-04-25 NOTE — Telephone Encounter (Signed)
Okay to refill, see if it is trazodone 50 or 100mg  [facility]

## 2014-04-25 NOTE — Telephone Encounter (Signed)
Ok to refill 

## 2014-04-25 NOTE — Telephone Encounter (Signed)
Call placed to patient.   States that prescription is for Trazodone 100mg .   Prescription sent to pharmacy.

## 2014-04-25 NOTE — Telephone Encounter (Signed)
[phone]  PT is needing a refill on Kristine Ortega ( she was on it in the hospital and it works) she is scheduled to see another dr next week. And she is needing some to help her sleep bc she is not able to  Pharmacy [facility]

## 2014-05-05 ENCOUNTER — Other Ambulatory Visit: Payer: Self-pay | Admitting: *Deleted

## 2014-05-23 ENCOUNTER — Telehealth: Payer: Self-pay | Admitting: Family Medicine

## 2014-05-23 NOTE — Telephone Encounter (Signed)
Patient is calling regarding her psychology doctor  And switching back to dr Buelah Manis for her care [phone]

## 2014-05-23 NOTE — Telephone Encounter (Signed)
Call placed to patient.   States that she would like to go to Dr. Kathe Becton, who has a [facility] on [address]. States that Dr. Aneta Mins is a psychologist, and would not prescribe any medication changes.   States that she is stable at this time and is ok with her current medications. Requested to have MD cover for prescriptions and she could continue with counseling services.   MD please advise.

## 2014-05-24 NOTE — Telephone Encounter (Signed)
Okay to do referral to the psychiatrist if needed Get updated med list and okay to refill, have her schedule an appt in the office in 1 month

## 2014-05-25 NOTE — Telephone Encounter (Signed)
Call placed to patient.   States that she does not need any refills at this time, and she does not require a new refill for the counseling services.   Reports that she will call back to schedule appointment.

## 2014-05-30 ENCOUNTER — Telehealth: Payer: Self-pay | Admitting: *Deleted

## 2014-05-30 MED ORDER — MIRTAZAPINE 7.5 MG HALF TABLET: 7.5000 mg | ORAL_TABLET | Freq: Every day | ORAL | Status: DC

## 2014-05-30 MED ORDER — LORAZEPAM 0.5 MG PO TABS: 0.5000 mg | ORAL_TABLET | Freq: Three times a day (TID) | ORAL | Status: DC | PRN

## 2014-05-30 NOTE — Telephone Encounter (Signed)
Received call from patient.   Reports that she was seen by Manuela Schwartz at [place] [facility] on 05/29/2014.  States that some of her medications have been changed.   States that she is now currently taking Ativan 0.5mg  TID PRN and Remeron 75mg  [facility].   States that she will return to [facility] in (1) month to discuss medications again. If she is stable, then she will transfer to the counseling center.   MD made aware.

## 2014-07-25 ENCOUNTER — Other Ambulatory Visit: Payer: Self-pay | Admitting: Family Medicine

## 2014-07-26 NOTE — Telephone Encounter (Signed)
?   OK to Refill - Last refill 04/25/2014 #30/0  Last OV 03/02/14

## 2014-07-27 NOTE — Telephone Encounter (Signed)
Prescription sent to pharmacy.

## 2014-07-27 NOTE — Telephone Encounter (Signed)
okay

## 2014-09-14 ENCOUNTER — Ambulatory Visit (INDEPENDENT_AMBULATORY_CARE_PROVIDER_SITE_OTHER): Payer: BLUE CROSS/BLUE SHIELD | Admitting: Family Medicine

## 2014-09-14 ENCOUNTER — Encounter: Payer: Self-pay | Admitting: Family Medicine

## 2014-09-14 VITALS — BP 128/74 | HR 68 | Temp 98.1°F | Resp 14 | Ht 64.0 in | Wt 149.0 lb

## 2014-09-14 DIAGNOSIS — F411 Generalized anxiety disorder: Secondary | ICD-10-CM

## 2014-09-14 DIAGNOSIS — F3181 Bipolar II disorder: Secondary | ICD-10-CM

## 2014-09-14 DIAGNOSIS — F322 Major depressive disorder, single episode, severe without psychotic features: Secondary | ICD-10-CM

## 2014-09-14 MED ORDER — TRAZODONE HCL 100 MG PO TABS: ORAL_TABLET | ORAL | Status: DC

## 2014-09-14 MED ORDER — LAMOTRIGINE 100 MG PO TABS: 100.0000 mg | ORAL_TABLET | Freq: Two times a day (BID) | ORAL | Status: DC

## 2014-09-14 MED ORDER — MIRTAZAPINE 15 MG PO TABS: 15.0000 mg | ORAL_TABLET | Freq: Every day | ORAL | Status: DC

## 2014-09-14 MED ORDER — ALPRAZOLAM 0.25 MG PO TABS: ORAL_TABLET | ORAL | Status: DC

## 2014-09-14 NOTE — Patient Instructions (Signed)
Referral [name] MD for psychiatry For Vibryd follow the titration package Decrease REMERON to 1/2 tablet for 1 week, then stop  Continue all other medications

## 2014-09-16 ENCOUNTER — Encounter: Payer: Self-pay | Admitting: Family Medicine

## 2014-09-16 DIAGNOSIS — F3181 Bipolar II disorder: Secondary | ICD-10-CM | POA: Insufficient documentation

## 2014-09-16 NOTE — Progress Notes (Signed)
Patient ID: Kristine Ortega, female   DOB: 04-12-1966, 49 y.o.   MRN: [id number]   Subjective:    Patient ID: Kristine Ortega, female    DOB: [birth year], 49 y.o.   MRN: [id number]  Patient presents for Medication Review  Pt here to review medications, I did receive her psychiatrist notes at the end of the visit she has diagnossed of MDD, GAD and Bipolar II depression, but she is was not aware of the bipolar II diagnosis. She is trying to get in with a new psychiatrist but on waiting list until April. She is feeling more depressed and having low days and wants her medications tweaked. She does not see any improvement with Remeron at all, taking trazodone, lamictal and xanax at least once a day, she will not take more than that because she thinks she will get dependent on them. NO SI. Has good family support.   Review Of Systems:  GEN- denies fatigue, fever, weight loss,weakness, recent illness HEENT- denies eye drainage, change in vision, nasal discharge, CVS- denies chest pain, palpitations RESP- denies SOB, cough, wheeze ABD- denies N/V, change in stools, abd pain GU- denies dysuria, hematuria, dribbling, incontinence MSK- denies joint pain, muscle aches, injury Neuro- denies headache, dizziness, syncope, seizure activity       Objective:    BP 128/74 mmHg  Pulse 68  Temp(Src) 98.1 F (36.7 C) (Oral)  Resp 14  Ht 5\' 4"  (1.626 m)  Wt 149 lb (67.586 kg)  BMI 25.56 kg/m2 GEN- NAD, alert and oriented x3 Psych- smiling, affect appropriate but becomes tearful at times, not overly anxious, well groomed, no SI,        Assessment & Plan:      Problem List Items Addressed This Visit    None      Note: This dictation was prepared with Dragon dictation along with smaller phrase technology. Any transcriptional errors that result from this process are unintentional.

## 2014-09-16 NOTE — Assessment & Plan Note (Signed)
For now okay to use xanax 0.25mg  BID to get on top of anxiety especially with ongoing medication changes

## 2014-09-16 NOTE — Assessment & Plan Note (Signed)
Will taper off remeron over next week and try Vibryd, given a starter kit to taper up on this to the therapuetic 19m Will send new referral to see if we can get her in earlier with new psychiatrist her old one has left the practice  Note she has been on Effexor, zoloft, prozac, brintillex, lexapro, lamictal, seroquel, trazodone, remeron, xanax, ativan

## 2014-10-01 ENCOUNTER — Telehealth: Payer: Self-pay | Admitting: Family Medicine

## 2014-10-01 NOTE — Telephone Encounter (Signed)
[phone] PT is wanting to let you know that the medication ( i believe she spelt it vilberd) is making her stomach upset Also she is wanting to know the status of her apt with Dr [name]

## 2014-10-01 NOTE — Telephone Encounter (Signed)
pt has appt on April 6 with Dr. [name], pt called and made appt herself

## 2014-10-01 NOTE — Telephone Encounter (Signed)
Pt has appt rescheduled with Dr. Lita Mains on Thursday Jan 28 at 1:15pm

## 2014-10-01 NOTE — Telephone Encounter (Signed)
[name] in the note I sent you I need an earlier appt, April is too long, please call and get her an earlier appt needs ASAP    Kristine Ortega, please call pt and have her decrease back to 20mg  at stay at this dose and the GI symptoms should go away

## 2014-10-01 NOTE — Telephone Encounter (Signed)
Call placed to patient. Reports that she is taking Vybrid.  Reports that she has nausea, indigestion, abdominal cramping and diarrhea daily.   Reports that it seems to be getting worse with the increased dosage of Vybrid.   States that she takes medication mid-morning. Denies any difficulties immediately following consumption. States that she begins having symptoms later in the afternoon and stretching into early morning.   Also states that we were supposed to call and see if we could get earlier appointment with psych.   MD please advise.

## 2014-10-01 NOTE — Telephone Encounter (Signed)
Pt aware of new appt and to decrease her medication to 20mg 

## 2014-11-05 ENCOUNTER — Telehealth: Payer: Self-pay | Admitting: Family Medicine

## 2014-11-05 MED ORDER — SUMATRIPTAN SUCCINATE 100 MG PO TABS: 100.0000 mg | ORAL_TABLET | ORAL | Status: DC | PRN

## 2014-11-05 NOTE — Telephone Encounter (Signed)
Refill appropriate and filled per protocol. 

## 2014-11-05 NOTE — Telephone Encounter (Signed)
Patient is calling to see if we can sent new rx for her immatrex to her pharmacy is possible  [facility]

## 2015-05-02 ENCOUNTER — Other Ambulatory Visit: Payer: Self-pay | Admitting: Obstetrics and Gynecology

## 2015-05-07 LAB — CYTOLOGY - PAP

## 2015-05-08 ENCOUNTER — Other Ambulatory Visit: Payer: Self-pay | Admitting: Obstetrics and Gynecology

## 2015-05-08 DIAGNOSIS — Z853 Personal history of malignant neoplasm of breast: Secondary | ICD-10-CM

## 2015-05-21 ENCOUNTER — Ambulatory Visit
Admission: RE | Admit: 2015-05-21 | Discharge: 2015-05-21 | Disposition: A | Discharge status: Home / Self Care | Payer: BLUE CROSS/BLUE SHIELD | Source: Ambulatory Visit | Attending: Obstetrics and Gynecology | Admitting: Obstetrics and Gynecology

## 2015-05-21 DIAGNOSIS — Z853 Personal history of malignant neoplasm of breast: Secondary | ICD-10-CM

## 2015-05-21 LAB — HM MAMMOGRAPHY

## 2015-05-21 MED ORDER — GADOBENATE DIMEGLUMINE 529 MG/ML IV SOLN
13.0000 mL | Freq: Once | INTRAVENOUS | Status: AC | PRN
  Administered 2015-05-21: 13 mL via INTRAVENOUS

## 2015-06-04 ENCOUNTER — Other Ambulatory Visit: Payer: Self-pay | Admitting: Obstetrics and Gynecology

## 2015-06-05 LAB — CYTOLOGY - PAP

## 2015-12-03 ENCOUNTER — Other Ambulatory Visit: Payer: Self-pay | Admitting: Family Medicine

## 2015-12-03 NOTE — Telephone Encounter (Signed)
Refill appropriate and filled per protocol. 

## 2016-08-19 ENCOUNTER — Ambulatory Visit (INDEPENDENT_AMBULATORY_CARE_PROVIDER_SITE_OTHER): Payer: BLUE CROSS/BLUE SHIELD | Admitting: Family Medicine

## 2016-08-19 ENCOUNTER — Encounter: Payer: Self-pay | Admitting: Family Medicine

## 2016-08-19 VITALS — BP 128/64 | HR 70 | Temp 98.1°F | Resp 12 | Ht 64.0 in | Wt 137.0 lb

## 2016-08-19 DIAGNOSIS — F3181 Bipolar II disorder: Secondary | ICD-10-CM | POA: Diagnosis not present

## 2016-08-19 DIAGNOSIS — Z170 Estrogen receptor positive status [ER+]: Secondary | ICD-10-CM

## 2016-08-19 DIAGNOSIS — Z0000 Encounter for general adult medical examination without abnormal findings: Secondary | ICD-10-CM | POA: Diagnosis not present

## 2016-08-19 DIAGNOSIS — M8580 Other specified disorders of bone density and structure, unspecified site: Secondary | ICD-10-CM

## 2016-08-19 DIAGNOSIS — G43709 Chronic migraine without aura, not intractable, without status migrainosus: Secondary | ICD-10-CM

## 2016-08-19 DIAGNOSIS — C50912 Malignant neoplasm of unspecified site of left female breast: Secondary | ICD-10-CM

## 2016-08-19 LAB — COMPREHENSIVE METABOLIC PANEL
ALBUMIN: 4.6 g/dL (ref 3.6–5.1)
ALT: 15 U/L (ref 6–29)
AST: 18 U/L (ref 10–35)
Alkaline Phosphatase: 40 U/L (ref 33–130)
BUN: 16 mg/dL (ref 7–25)
CHLORIDE: 105 mmol/L (ref 98–110)
CO2: 30 mmol/L (ref 20–31)
CREATININE: 0.85 mg/dL (ref 0.50–1.05)
Calcium: 9.9 mg/dL (ref 8.6–10.4)
Glucose, Bld: 83 mg/dL (ref 70–99)
Potassium: 4.7 mmol/L (ref 3.5–5.3)
SODIUM: 143 mmol/L (ref 135–146)
TOTAL PROTEIN: 7.2 g/dL (ref 6.1–8.1)
Total Bilirubin: 0.5 mg/dL (ref 0.2–1.2)

## 2016-08-19 LAB — CBC WITH DIFFERENTIAL/PLATELET
BASOS ABS: 41 {cells}/uL (ref 0–200)
Basophils Relative: 1 %
EOS ABS: 82 {cells}/uL (ref 15–500)
Eosinophils Relative: 2 %
HEMATOCRIT: 43.4 % (ref 35.0–45.0)
HEMOGLOBIN: 14.3 g/dL (ref 12.0–15.0)
LYMPHS ABS: 1353 {cells}/uL (ref 850–3900)
Lymphocytes Relative: 33 %
MCH: 28 pg (ref 27.0–33.0)
MCHC: 32.9 g/dL (ref 32.0–36.0)
MCV: 85.1 fL (ref 80.0–100.0)
MONO ABS: 328 {cells}/uL (ref 200–950)
MPV: 10.2 fL (ref 7.5–12.5)
Monocytes Relative: 8 %
NEUTROS ABS: 2296 {cells}/uL (ref 1500–7800)
Neutrophils Relative %: 56 %
Platelets: 236 10*3/uL (ref 140–400)
RBC: 5.1 MIL/uL (ref 3.80–5.10)
RDW: 13.4 % (ref 11.0–15.0)
WBC: 4.1 10*3/uL (ref 3.8–10.8)

## 2016-08-19 LAB — LIPID PANEL
CHOL/HDL RATIO: 5.6 ratio — AB (ref <5.0)
CHOLESTEROL: 305 mg/dL — AB (ref <200)
HDL: 54 mg/dL (ref >50)
LDL Cholesterol: 227 mg/dL — ABNORMAL HIGH (ref <100)
Triglycerides: 121 mg/dL (ref <150)
VLDL: 24 mg/dL (ref <30)

## 2016-08-19 LAB — TSH: TSH: 0.85 m[IU]/L

## 2016-08-19 NOTE — Assessment & Plan Note (Signed)
Continue Imitrex as needed

## 2016-08-19 NOTE — Assessment & Plan Note (Signed)
She continues to follow with psychiatry but she has been stable on her Lamictal

## 2016-08-19 NOTE — Assessment & Plan Note (Signed)
She'll plan to have reconstructive surgery in 2018 she will need a repeat breast MRI in the fall

## 2016-08-19 NOTE — Patient Instructions (Signed)
F/U 1 year for physical or as needed Release of records- [facility] Release of records- Dr. Watt Climes- GI

## 2016-08-19 NOTE — Assessment & Plan Note (Signed)
Bone density done last year Will obtain records from her gynecologist. She is on topical hormone replacement. She is taking calcium and vitamin D supplements.

## 2016-08-19 NOTE — Progress Notes (Signed)
   Subjective:    Patient ID: Kristine Ortega, female    DOB: [birth year], 50 y.o.   MRN: [id number]  Patient presents for CPE (is fasting)  Pt here for CPE No PAP s/p hysterectomy Mammogram- history of left breast cancer s/p hysterectomy- last MRI Sep 2016 - in Fall of 2018 need repeat  Breast MRI  ostepenia- had bone density last year Performed by her previous GYN Dr. Candie Mile. She is also using a topical testosterone cream as well as a Premarin cream compounded by custom pharmacy COlonoscopy- Had 2 years Dr. Watt Climes   Dr. Lita Mains - psychiatry she's been maintained on Lamictal and has been doing well. She's no longer any therapy. TDAP- UTD Flu shot- declines  Medications reviewed   Review Of Systems:  GEN- denies fatigue, fever, weight loss,weakness, recent illness HEENT- denies eye drainage, change in vision, nasal discharge, CVS- denies chest pain, palpitations RESP- denies SOB, cough, wheeze ABD- denies N/V, change in stools, abd pain GU- denies dysuria, hematuria, dribbling, incontinence MSK- denies joint pain, muscle aches, injury Neuro- denies headache, dizziness, syncope, seizure activity       Objective:    BP 128/64 (BP Location: Left Arm, Patient Position: Sitting, Cuff Size: Normal)   Pulse 70   Temp 98.1 F (36.7 C) (Oral)   Resp 12   Ht 5\' 4"  (1.626 m)   Wt 137 lb (62.1 kg)   SpO2 99%   BMI 23.52 kg/m  GEN- NAD, alert and oriented x3 HEENT- PERRL, EOMI, non injected sclera, pink conjunctiva, MMM, oropharynx clear, TM clear bilat no effusion  Neck- Supple, no thyromegaly CVS- RRR, no murmur RESP-CTAB ABD-NABS,soft,NT,ND EXT- No edema Pulses- Radial, DP- 2+        Assessment & Plan:      Problem List Items Addressed This Visit    Osteopenia    Bone density done last year Will obtain records from her gynecologist. She is on topical hormone replacement. She is taking calcium and vitamin D supplements.      Relevant Orders   Vitamin D, 25-hydroxy   Migraines    Continue Imitrex as needed      Breast cancer, left ([place])    She'll plan to have reconstructive surgery in 2018 she will need a repeat breast MRI in the fall      Bipolar II disorder ([place])    She continues to follow with psychiatry but she has been stable on her Lamictal       Other Visit Diagnoses    Routine general medical examination at a health care facility    -  Primary   CPE done, obtain records, declines flu shotl labs today   Relevant Orders   CBC with Differential/Platelet   Comprehensive metabolic panel   TSH   Lipid panel      Note: This dictation was prepared with Dragon dictation along with smaller phrase technology. Any transcriptional errors that result from this process are unintentional.

## 2016-08-20 LAB — VITAMIN D 25 HYDROXY (VIT D DEFICIENCY, FRACTURES): Vit D, 25-Hydroxy: 41 ng/mL (ref 30–100)

## 2016-08-27 ENCOUNTER — Encounter: Payer: Self-pay | Admitting: Family Medicine

## 2016-09-20 ENCOUNTER — Other Ambulatory Visit: Payer: Self-pay | Admitting: Family Medicine

## 2016-09-22 ENCOUNTER — Ambulatory Visit (INDEPENDENT_AMBULATORY_CARE_PROVIDER_SITE_OTHER): Payer: BLUE CROSS/BLUE SHIELD

## 2016-09-22 DIAGNOSIS — Z23 Encounter for immunization: Secondary | ICD-10-CM

## 2016-09-25 ENCOUNTER — Other Ambulatory Visit: Payer: Self-pay | Admitting: *Deleted

## 2016-09-25 MED ORDER — SUMATRIPTAN SUCCINATE 100 MG PO TABS: ORAL_TABLET | ORAL | 3 refills | Status: DC

## 2016-09-25 MED ORDER — ESTROGENS, CONJUGATED 0.625 MG/GM VA CREA: TOPICAL_CREAM | VAGINAL | 2 refills | Status: DC

## 2016-09-25 MED ORDER — LAMOTRIGINE 100 MG PO TABS: 100.0000 mg | ORAL_TABLET | Freq: Two times a day (BID) | ORAL | 3 refills | Status: DC

## 2016-10-28 ENCOUNTER — Ambulatory Visit (INDEPENDENT_AMBULATORY_CARE_PROVIDER_SITE_OTHER): Payer: BLUE CROSS/BLUE SHIELD | Admitting: Physician Assistant

## 2016-10-28 ENCOUNTER — Encounter: Payer: Self-pay | Admitting: Physician Assistant

## 2016-10-28 VITALS — BP 110/80 | HR 73 | Temp 97.9°F | Resp 16 | Wt 143.6 lb

## 2016-10-28 DIAGNOSIS — S0101XA Laceration without foreign body of scalp, initial encounter: Secondary | ICD-10-CM

## 2016-10-28 MED ORDER — AMOXICILLIN-POT CLAVULANATE 875-125 MG PO TABS: 1.0000 | ORAL_TABLET | Freq: Two times a day (BID) | ORAL | 0 refills | Status: DC

## 2016-10-29 NOTE — Progress Notes (Signed)
    Patient ID: Kristine Ortega MRN: [id number], DOB: Mar 16, 1966, 51 y.o. Date of Encounter: 10/29/2016, 7:21 AM    Chief Complaint:  Chief Complaint  Patient presents with  . pecked by rooster on head     HPI: 51 y.o. year old female presents with above.   Says that yesterday she was taking care of the chicken/rooster. Bent over to put water in the container. Rooster "attacked her "and was pecking at her head. He has never acted this way before.  (They have subsequently killed the rooster.) She has several bloody areas in her scalp. When she researched on the Internet she saw things to do with tetanus and antibiotics as infection can be high since the roosters have been in chicken droppings. So, came in for OV. No other concerns to address.      Home Meds:   Outpatient Medications Prior to Visit  Medication Sig Dispense Refill  . conjugated estrogens (PREMARIN) vaginal cream USE AS DIRECTED 90 g 2  . lamoTRIgine (LAMICTAL) 100 MG tablet Take 1 tablet (100 mg total) by mouth 2 (two) times daily. 180 tablet 3  . SUMAtriptan (IMITREX) 100 MG tablet TAKE 1 TABLET (100 MG TOTAL) BY MOUTH AS NEEDED. 90 tablet 3   No facility-administered medications prior to visit.     Allergies: No Known Allergies    Review of Systems: See HPI for pertinent ROS. All other ROS negative.    Physical Exam: Blood pressure 110/80, pulse 73, temperature 97.9 F (36.6 C), temperature source Oral, resp. rate 16, weight 143 lb 9.6 oz (65.1 kg), SpO2 99 %., Body mass index is 24.65 kg/m. General: WNWD WF.  Appears in no acute distress. Neck: Supple. No thyromegaly. No lymphadenopathy. Lungs: Clear bilaterally to auscultation without wheezes, rales, or rhonchi. Breathing is unlabored. Heart: Regular rhythm. No murmurs, rubs, or gallops. Msk:  Strength and tone normal for age. Skin: Scalp: There is ~ 1cm diameter bloody area on left top of head. (superficial, no deep wound). ~1/2cm diameter bloody area on  left occipital region. Very small superficial area "scratch" left postauricular. Neuro: Alert and oriented X 3. Moves all extremities spontaneously. Gait is normal. CNII-XII grossly in tact. Psych:  Responds to questions appropriately with a normal affect.     ASSESSMENT AND PLAN:  51 y.o. year old female with  1. Laceration of scalp without foreign body, initial encounter Tetanus is up to date--52/05/2013. Will treat with Augmentin. Told her to follow-up if any signs of infection including purulent drainage, increased surrounding erythema. - amoxicillin-clavulanate (AUGMENTIN) 875-125 MG tablet; Take 1 tablet by mouth 2 (two) times daily.  Dispense: 20 tablet; Refill: 0   Signed, [address] [place], [place], [facility] 10/29/2016 7:21 AM

## 2017-02-02 ENCOUNTER — Encounter: Payer: Self-pay | Admitting: Family Medicine

## 2017-02-04 MED ORDER — NONFORMULARY OR COMPOUNDED ITEM
2 refills | Status: DC
Start: 2017-02-04 — End: 2019-01-09

## 2017-04-23 ENCOUNTER — Encounter: Payer: Self-pay | Admitting: Family Medicine

## 2017-05-18 DIAGNOSIS — Z9013 Acquired absence of bilateral breasts and nipples: Secondary | ICD-10-CM | POA: Insufficient documentation

## 2017-05-18 DIAGNOSIS — N651 Disproportion of reconstructed breast: Secondary | ICD-10-CM | POA: Insufficient documentation

## 2017-05-24 ENCOUNTER — Encounter: Payer: Self-pay | Admitting: Family Medicine

## 2017-06-03 ENCOUNTER — Telehealth: Payer: Self-pay

## 2017-06-03 ENCOUNTER — Encounter: Payer: Self-pay | Admitting: Physician Assistant

## 2017-06-03 NOTE — Telephone Encounter (Signed)
Error

## 2017-07-06 ENCOUNTER — Encounter (HOSPITAL_BASED_OUTPATIENT_CLINIC_OR_DEPARTMENT_OTHER): Payer: Self-pay | Admitting: *Deleted

## 2017-07-14 ENCOUNTER — Ambulatory Visit: Payer: Self-pay | Admitting: Plastic Surgery

## 2017-07-14 DIAGNOSIS — N651 Disproportion of reconstructed breast: Secondary | ICD-10-CM

## 2017-07-14 DIAGNOSIS — Z853 Personal history of malignant neoplasm of breast: Secondary | ICD-10-CM

## 2017-07-15 ENCOUNTER — Ambulatory Visit (HOSPITAL_BASED_OUTPATIENT_CLINIC_OR_DEPARTMENT_OTHER): Payer: BLUE CROSS/BLUE SHIELD | Admitting: Anesthesiology

## 2017-07-15 ENCOUNTER — Ambulatory Visit (HOSPITAL_BASED_OUTPATIENT_CLINIC_OR_DEPARTMENT_OTHER)
Admission: RE | Admit: 2017-07-15 | Discharge: 2017-07-15 | Disposition: A | Discharge status: Home / Self Care | Payer: BLUE CROSS/BLUE SHIELD | Source: Ambulatory Visit | Attending: Plastic Surgery | Admitting: Plastic Surgery

## 2017-07-15 ENCOUNTER — Other Ambulatory Visit: Payer: Self-pay

## 2017-07-15 ENCOUNTER — Encounter (HOSPITAL_BASED_OUTPATIENT_CLINIC_OR_DEPARTMENT_OTHER): Payer: Self-pay | Admitting: Emergency Medicine

## 2017-07-15 ENCOUNTER — Encounter (HOSPITAL_BASED_OUTPATIENT_CLINIC_OR_DEPARTMENT_OTHER)
Admission: RE | Disposition: A | Discharge status: Home / Self Care | Payer: Self-pay | Source: Ambulatory Visit | Attending: Plastic Surgery

## 2017-07-15 DIAGNOSIS — Z79890 Hormone replacement therapy (postmenopausal): Secondary | ICD-10-CM | POA: Insufficient documentation

## 2017-07-15 DIAGNOSIS — Z9013 Acquired absence of bilateral breasts and nipples: Secondary | ICD-10-CM | POA: Diagnosis not present

## 2017-07-15 DIAGNOSIS — Z79899 Other long term (current) drug therapy: Secondary | ICD-10-CM | POA: Insufficient documentation

## 2017-07-15 DIAGNOSIS — N651 Disproportion of reconstructed breast: Secondary | ICD-10-CM

## 2017-07-15 DIAGNOSIS — Z853 Personal history of malignant neoplasm of breast: Secondary | ICD-10-CM | POA: Insufficient documentation

## 2017-07-15 HISTORY — DX: Other complications of anesthesia, initial encounter: T88.59XA

## 2017-07-15 HISTORY — PX: BREAST IMPLANT REMOVAL: SHX5361

## 2017-07-15 HISTORY — DX: Nausea with vomiting, unspecified: R11.2

## 2017-07-15 HISTORY — DX: Motion sickness, initial encounter: T75.3XXA

## 2017-07-15 HISTORY — DX: Other specified postprocedural states: Z98.890

## 2017-07-15 HISTORY — DX: Adverse effect of unspecified anesthetic, initial encounter: T41.45XA

## 2017-07-15 HISTORY — PX: PLACEMENT OF BREAST IMPLANTS: SHX6334

## 2017-07-15 SURGERY — REMOVAL, IMPLANT, BREAST
Anesthesia: General | Site: Breast | Laterality: Bilateral

## 2017-07-15 MED ORDER — FENTANYL CITRATE (PF) 100 MCG/2ML IJ SOLN
INTRAMUSCULAR | Status: AC
  Filled 2017-07-15: qty 2

## 2017-07-15 MED ORDER — SCOPOLAMINE 1 MG/3DAYS TD PT72
MEDICATED_PATCH | TRANSDERMAL | Status: AC
  Filled 2017-07-15: qty 1

## 2017-07-15 MED ORDER — SCOPOLAMINE 1 MG/3DAYS TD PT72: 1.0000 | MEDICATED_PATCH | Freq: Once | TRANSDERMAL | Status: DC

## 2017-07-15 MED ORDER — CEFAZOLIN SODIUM-DEXTROSE 2-4 GM/100ML-% IV SOLN
INTRAVENOUS | Status: AC
  Filled 2017-07-15: qty 100

## 2017-07-15 MED ORDER — EPHEDRINE SULFATE 50 MG/ML IJ SOLN
INTRAMUSCULAR | Status: DC | PRN
  Administered 2017-07-15: 10 mg via INTRAVENOUS

## 2017-07-15 MED ORDER — FENTANYL CITRATE (PF) 100 MCG/2ML IJ SOLN: 25.0000 ug | INTRAMUSCULAR | Status: DC | PRN

## 2017-07-15 MED ORDER — PROMETHAZINE HCL 25 MG/ML IJ SOLN: 6.2500 mg | INTRAMUSCULAR | Status: DC | PRN

## 2017-07-15 MED ORDER — OXYCODONE HCL 5 MG PO TABS
5.0000 mg | ORAL_TABLET | ORAL | Status: DC | PRN
  Administered 2017-07-15: 5 mg via ORAL

## 2017-07-15 MED ORDER — ACETAMINOPHEN 650 MG RE SUPP: 650.0000 mg | RECTAL | Status: DC | PRN

## 2017-07-15 MED ORDER — BUPIVACAINE-EPINEPHRINE 0.25% -1:200000 IJ SOLN
INTRAMUSCULAR | Status: DC | PRN
  Administered 2017-07-15: 5 mL

## 2017-07-15 MED ORDER — SUCCINYLCHOLINE CHLORIDE 200 MG/10ML IV SOSY
PREFILLED_SYRINGE | INTRAVENOUS | Status: AC
  Filled 2017-07-15: qty 10

## 2017-07-15 MED ORDER — EPHEDRINE 5 MG/ML INJ
INTRAVENOUS | Status: AC
  Filled 2017-07-15: qty 10

## 2017-07-15 MED ORDER — SODIUM CHLORIDE 0.9 % IJ SOLN
INTRAMUSCULAR | Status: AC
  Filled 2017-07-15: qty 30

## 2017-07-15 MED ORDER — ACETAMINOPHEN 500 MG PO TABS
1000.0000 mg | ORAL_TABLET | Freq: Once | ORAL | Status: AC
  Administered 2017-07-15: 1000 mg via ORAL

## 2017-07-15 MED ORDER — SODIUM CHLORIDE 0.9% FLUSH: 3.0000 mL | Freq: Two times a day (BID) | INTRAVENOUS | Status: DC

## 2017-07-15 MED ORDER — ACETAMINOPHEN 500 MG PO TABS
ORAL_TABLET | ORAL | Status: AC
  Filled 2017-07-15: qty 2

## 2017-07-15 MED ORDER — LACTATED RINGERS IV SOLN
INTRAVENOUS | Status: DC
Start: 2017-07-15 — End: 2017-07-15
  Administered 2017-07-15: 08:00:00 via INTRAVENOUS

## 2017-07-15 MED ORDER — DEXAMETHASONE SODIUM PHOSPHATE 4 MG/ML IJ SOLN
INTRAMUSCULAR | Status: DC | PRN
  Administered 2017-07-15: 10 mg via INTRAVENOUS

## 2017-07-15 MED ORDER — DEXAMETHASONE SODIUM PHOSPHATE 10 MG/ML IJ SOLN
INTRAMUSCULAR | Status: AC
  Filled 2017-07-15: qty 1

## 2017-07-15 MED ORDER — PROPOFOL 500 MG/50ML IV EMUL
INTRAVENOUS | Status: AC
  Filled 2017-07-15: qty 50

## 2017-07-15 MED ORDER — PHENYLEPHRINE 40 MCG/ML (10ML) SYRINGE FOR IV PUSH (FOR BLOOD PRESSURE SUPPORT)
PREFILLED_SYRINGE | INTRAVENOUS | Status: AC
  Filled 2017-07-15: qty 10

## 2017-07-15 MED ORDER — ONDANSETRON HCL 4 MG/2ML IJ SOLN
INTRAMUSCULAR | Status: AC
  Filled 2017-07-15: qty 2

## 2017-07-15 MED ORDER — PROPOFOL 10 MG/ML IV BOLUS
INTRAVENOUS | Status: DC | PRN
  Administered 2017-07-15: 150 mg via INTRAVENOUS

## 2017-07-15 MED ORDER — OXYCODONE HCL 5 MG PO TABS
ORAL_TABLET | ORAL | Status: AC
  Filled 2017-07-15: qty 1

## 2017-07-15 MED ORDER — MIDAZOLAM HCL 2 MG/2ML IJ SOLN
1.0000 mg | INTRAMUSCULAR | Status: DC | PRN
  Administered 2017-07-15: 2 mg via INTRAVENOUS

## 2017-07-15 MED ORDER — SODIUM CHLORIDE 0.9 % IV SOLN: 250.0000 mL | INTRAVENOUS | Status: DC | PRN

## 2017-07-15 MED ORDER — LIDOCAINE 2% (20 MG/ML) 5 ML SYRINGE
INTRAMUSCULAR | Status: AC
  Filled 2017-07-15: qty 5

## 2017-07-15 MED ORDER — FENTANYL CITRATE (PF) 100 MCG/2ML IJ SOLN
50.0000 ug | INTRAMUSCULAR | Status: AC | PRN
  Administered 2017-07-15: 50 ug via INTRAVENOUS
  Administered 2017-07-15: 100 ug via INTRAVENOUS
  Administered 2017-07-15: 50 ug via INTRAVENOUS

## 2017-07-15 MED ORDER — SCOPOLAMINE 1 MG/3DAYS TD PT72
1.0000 | MEDICATED_PATCH | Freq: Once | TRANSDERMAL | Status: DC | PRN
  Administered 2017-07-15: 1.5 mg via TRANSDERMAL

## 2017-07-15 MED ORDER — ACETAMINOPHEN 325 MG PO TABS
650.0000 mg | ORAL_TABLET | ORAL | Status: DC | PRN
Start: 2017-07-15 — End: 2017-07-15

## 2017-07-15 MED ORDER — MIDAZOLAM HCL 2 MG/2ML IJ SOLN
INTRAMUSCULAR | Status: AC
  Filled 2017-07-15: qty 2

## 2017-07-15 MED ORDER — POLYMYXIN B SULFATE 500000 UNITS IJ SOLR
INTRAMUSCULAR | Status: DC | PRN
  Administered 2017-07-15: 500 mL

## 2017-07-15 MED ORDER — SODIUM CHLORIDE 0.9% FLUSH: 3.0000 mL | INTRAVENOUS | Status: DC | PRN

## 2017-07-15 MED ORDER — LIDOCAINE HCL (CARDIAC) 20 MG/ML IV SOLN
INTRAVENOUS | Status: DC | PRN
  Administered 2017-07-15: 100 mg via INTRAVENOUS

## 2017-07-15 MED ORDER — CEFAZOLIN SODIUM-DEXTROSE 2-4 GM/100ML-% IV SOLN
2.0000 g | INTRAVENOUS | Status: AC
  Administered 2017-07-15: 2 g via INTRAVENOUS

## 2017-07-15 MED ORDER — ONDANSETRON HCL 4 MG/2ML IJ SOLN
INTRAMUSCULAR | Status: DC | PRN
  Administered 2017-07-15: 4 mg via INTRAVENOUS

## 2017-07-15 SURGICAL SUPPLY — 80 items
ADH SKN CLS APL DERMABOND .7 (GAUZE/BANDAGES/DRESSINGS) ×2
BAG DECANTER FOR FLEXI CONT (MISCELLANEOUS) ×3 IMPLANT
BINDER BREAST LRG (GAUZE/BANDAGES/DRESSINGS) ×3 IMPLANT
BINDER BREAST MEDIUM (GAUZE/BANDAGES/DRESSINGS) IMPLANT
BINDER BREAST XLRG (GAUZE/BANDAGES/DRESSINGS) IMPLANT
BINDER BREAST XXLRG (GAUZE/BANDAGES/DRESSINGS) IMPLANT
BIOPATCH RED 1 DISK 7.0 (GAUZE/BANDAGES/DRESSINGS) IMPLANT
BIOPATCH RED 1IN DISK 7.0MM (GAUZE/BANDAGES/DRESSINGS)
BLADE HEX COATED 2.75 (ELECTRODE) ×3 IMPLANT
BLADE SURG 15 STRL LF DISP TIS (BLADE) ×2 IMPLANT
BLADE SURG 15 STRL SS (BLADE) ×4
BNDG GAUZE ELAST 4 BULKY (GAUZE/BANDAGES/DRESSINGS) IMPLANT
CANISTER SUCT 1200ML W/VALVE (MISCELLANEOUS) ×3 IMPLANT
CHLORAPREP W/TINT 26ML (MISCELLANEOUS) ×3 IMPLANT
COVER BACK TABLE 60X90IN (DRAPES) ×3 IMPLANT
COVER MAYO STAND STRL (DRAPES) ×3 IMPLANT
DECANTER SPIKE VIAL GLASS SM (MISCELLANEOUS) IMPLANT
DERMABOND ADVANCED (GAUZE/BANDAGES/DRESSINGS) ×4
DERMABOND ADVANCED .7 DNX12 (GAUZE/BANDAGES/DRESSINGS) ×2 IMPLANT
DRAIN CHANNEL 19F RND (DRAIN) IMPLANT
DRAPE LAPAROSCOPIC ABDOMINAL (DRAPES) ×3 IMPLANT
DRSG PAD ABDOMINAL 8X10 ST (GAUZE/BANDAGES/DRESSINGS) ×6 IMPLANT
ELECT BLADE 4.0 EZ CLEAN MEGAD (MISCELLANEOUS) ×3
ELECT BLADE 6.5 .24CM SHAFT (ELECTRODE) IMPLANT
ELECT REM PT RETURN 9FT ADLT (ELECTROSURGICAL) ×3
ELECTRODE BLDE 4.0 EZ CLN MEGD (MISCELLANEOUS) ×1 IMPLANT
ELECTRODE REM PT RTRN 9FT ADLT (ELECTROSURGICAL) ×1 IMPLANT
EVACUATOR SILICONE 100CC (DRAIN) IMPLANT
GAUZE SPONGE 4X4 12PLY STRL LF (GAUZE/BANDAGES/DRESSINGS) IMPLANT
GLOVE BIO SURGEON STRL SZ 6.5 (GLOVE) ×8 IMPLANT
GLOVE BIO SURGEON STRL SZ7 (GLOVE) ×3 IMPLANT
GLOVE BIO SURGEONS STRL SZ 6.5 (GLOVE) ×4
GOWN STRL REUS W/ TWL LRG LVL3 (GOWN DISPOSABLE) ×2 IMPLANT
GOWN STRL REUS W/ TWL XL LVL3 (GOWN DISPOSABLE) ×1 IMPLANT
GOWN STRL REUS W/TWL LRG LVL3 (GOWN DISPOSABLE) ×4
GOWN STRL REUS W/TWL XL LVL3 (GOWN DISPOSABLE) ×2
IMPL GEL 500CC (Breast) ×2 IMPLANT
IMPLANT GEL 500CC (Breast) ×6 IMPLANT
IV NS 1000ML (IV SOLUTION)
IV NS 1000ML BAXH (IV SOLUTION) IMPLANT
IV NS 500ML (IV SOLUTION)
IV NS 500ML BAXH (IV SOLUTION) IMPLANT
KIT FILL SYSTEM UNIVERSAL (SET/KITS/TRAYS/PACK) IMPLANT
NDL SAFETY ECLIPSE 18X1.5 (NEEDLE) IMPLANT
NEEDLE HYPO 18GX1.5 SHARP (NEEDLE)
NEEDLE HYPO 25X1 1.5 SAFETY (NEEDLE) ×3 IMPLANT
NEEDLE SPNL 18GX3.5 QUINCKE PK (NEEDLE) IMPLANT
NS IRRIG 1000ML POUR BTL (IV SOLUTION) IMPLANT
PACK BASIN DAY SURGERY FS (CUSTOM PROCEDURE TRAY) ×3 IMPLANT
PENCIL BUTTON HOLSTER BLD 10FT (ELECTRODE) ×3 IMPLANT
PIN SAFETY STERILE (MISCELLANEOUS) IMPLANT
SIZER BREAST GEL REUSE 475CC (SIZER) ×3
SIZER BREAST HP REUSE 500CC (SIZER) ×3
SIZER BRST GEL REUSE 475CC (SIZER) ×1 IMPLANT
SIZER BRST HP REUSE 500CC (SIZER) ×1 IMPLANT
SLEEVE SCD COMPRESS KNEE MED (MISCELLANEOUS) ×3 IMPLANT
SPONGE LAP 18X18 X RAY DECT (DISPOSABLE) ×6 IMPLANT
SUT MNCRL AB 4-0 PS2 18 (SUTURE) ×6 IMPLANT
SUT MON AB 3-0 SH 27 (SUTURE) ×4
SUT MON AB 3-0 SH27 (SUTURE) ×2 IMPLANT
SUT MON AB 5-0 PS2 18 (SUTURE) ×6 IMPLANT
SUT PDS 3-0 CT2 (SUTURE) ×9
SUT PDS AB 2-0 CT2 27 (SUTURE) IMPLANT
SUT PDS II 3-0 CT2 27 ABS (SUTURE) ×3 IMPLANT
SUT PROLENE 3 0 PS 2 (SUTURE) IMPLANT
SUT SILK 3 0 PS 1 (SUTURE) IMPLANT
SUT VIC AB 3-0 SH 27 (SUTURE)
SUT VIC AB 3-0 SH 27X BRD (SUTURE) IMPLANT
SUT VICRYL 4-0 PS2 18IN ABS (SUTURE) IMPLANT
SWAB COLLECTION DEVICE MRSA (MISCELLANEOUS) IMPLANT
SWAB CULTURE ESWAB REG 1ML (MISCELLANEOUS) IMPLANT
SYR 50ML LL SCALE MARK (SYRINGE) IMPLANT
SYR BULB IRRIGATION 50ML (SYRINGE) ×3 IMPLANT
SYR CONTROL 10ML LL (SYRINGE) ×3 IMPLANT
TOWEL GREEN STERILE FF (TOWEL DISPOSABLE) IMPLANT
TOWEL OR 17X24 6PK STRL BLUE (TOWEL DISPOSABLE) ×6 IMPLANT
TUBE CONNECTING 20'X1/4 (TUBING) ×1
TUBE CONNECTING 20X1/4 (TUBING) ×2 IMPLANT
UNDERPAD 30X30 (UNDERPADS AND DIAPERS) ×6 IMPLANT
YANKAUER SUCT BULB TIP NO VENT (SUCTIONS) ×3 IMPLANT

## 2017-07-15 NOTE — Op Note (Signed)
Op report   DATE OF OPERATION: 07/15/2017  LOCATION: [facility]  SURGICAL DIVISION: Plastic Surgery  PREOPERATIVE DIAGNOSES:  1.History of breast cancer.  2. Acquired absence of bilateral breast.   POSTOPERATIVE DIAGNOSES:  1. History of breast cancer.  2. Acquired absence of bilateral breast.   PROCEDURE:  1. Bilateral removal of breast implants. 2. Bilateral capsulotomies for implant respositioning. 3. Placement of bilateral silicone breast implants.  SURGEON: Claire Sanger Dillingham, DO  ASSISTANT: Shawn Rayburn, PA  ANESTHESIA:  General.   COMPLICATIONS: None.   IMPLANTS: Left - Mentor Smooth Round High Profile Gel 500cc. Ref #[id number].  Serial Number [id number] Right - Mentor Smooth Round High Profile Gel 500cc. Ref #[id number].  Serial Number [id number]  INDICATIONS FOR PROCEDURE:  The patient, Kristine Ortega, is a 51 y.o. female born on 1966/02/06, is here for treatment after breast reconstruction from mastectomies over 10 years ago.  She now has asymemtry.  She requires capsulotomies to better position the implants. MRN: [id number]  CONSENT:  Informed consent was obtained directly from the patient. Risks, benefits and alternatives were fully discussed. Specific risks including but not limited to bleeding, infection, hematoma, seroma, scarring, pain, implant infection, implant extrusion, capsular contracture, asymmetry, wound healing problems, and need for further surgery were all discussed. The patient did have an ample opportunity to have her questions answered to her satisfaction.   DESCRIPTION OF PROCEDURE:  The patient was taken to the operating room. SCDs were placed and IV antibiotics were given. The patient's chest was prepped and draped in a sterile fashion. A time out was performed and the implants to be used were identified.    On the right breast: Local with epinephrine was used to infiltrate at the incision site. The scar was  incised at the lateral aspect of the nipple areola.  The mastectomy flaps from the superior and inferior flaps were raised over the pectoralis major muscle for several centimeters to minimize tension for the closure. The capsule was split at a diagonal to the skin incision to expose and remove the implant that was a Mentor 450 cc.  Inspection of the pocket showed a normal healthy capsule.  The pocket was irrigated with antibiotic solution.  Lateral capsulectomy was performed.  The capsule was then repositioned 1-2 cm more medially for improved position placement of the implant with 3-0 PDS.  Measurements were made and a sizer used to confirm adequate pocket size for the implant dimensions.  The 475 cc seemed to not be significantly better than the 450cc previous implants.  Therefore the 500 cc was selected after much thought. Hemostasis was ensured with electrocautery. New gloves were placed. The implant was soaked in antibiotic solution and then placed in the pocket and oriented appropriately. The capsule was re-closed with a 3-0 Monocryl suture. The remaining skin was closed with 4-0 Monocryl deep dermal and 5-0 Monocryl subcuticular stitches.   On the left breast:  Local with epinephrine was used to infiltrate at the incision site. The scar was incised at the lateral aspect of the nipple areola.  The mastectomy flaps from the superior and inferior flaps were raised over the pectoralis major muscle for several centimeters to minimize tension for the closure. The capsule was split at a diagonal to the skin incision to expose and remove the implant that was a Mentor 450 cc.  Inspection of the pocket showed a normal healthy capsule.  The pocket was irrigated with antibiotic solution.  Lateral capsulectomy was performed.  The capsule was then repositioned 1-2 cm more medially for improved position placement of the implant with 3-0 PDS.  Measurements were made and a sizer used to confirm adequate pocket size for  the implant dimensions.  Hemostasis was ensured with electrocautery. New gloves were placed. The implant was soaked in antibiotic solution and then placed in the pocket and oriented appropriately. The capsule was re-closed with a 3-0 Monocryl suture. The remaining skin was closed with 4-0 Monocryl deep dermal and 5-0 Monocryl subcuticular stitches.  Dermabond was applied to the incision site. A breast binder and ABDs were placed.  The patient was allowed to wake from anesthesia and taken to the recovery room in satisfactory condition.

## 2017-07-15 NOTE — Discharge Instructions (Addendum)
May shower tomorrow. No heavy lifting. Continue binder or sports bra.    Call your surgeon if you experience:   1.  Fever over 101.0. 2.  Inability to urinate. 3.  Nausea and/or vomiting. 4.  Extreme swelling or bruising at the surgical site. 5.  Continued bleeding from the incision. 6.  Increased pain, redness or drainage from the incision. 7.  Problems related to your pain medication. 8.  Any problems and/or concerns     Post Anesthesia Home Care Instructions  Activity: Get plenty of rest for the remainder of the day. A responsible individual must stay with you for 24 hours following the procedure.  For the next 24 hours, DO NOT: -Drive a car -Paediatric nurse -Drink alcoholic beverages -Take any medication unless instructed by your physician -Make any legal decisions or sign important papers.  Meals: Start with liquid foods such as gelatin or soup. Progress to regular foods as tolerated. Avoid greasy, spicy, heavy foods. If nausea and/or vomiting occur, drink only clear liquids until the nausea and/or vomiting subsides. Call your physician if vomiting continues.  Special Instructions/Symptoms: Your throat may feel dry or sore from the anesthesia or the breathing tube placed in your throat during surgery. If this causes discomfort, gargle with warm salt water. The discomfort should disappear within 24 hours.  If you had a scopolamine patch placed behind your ear for the management of post- operative nausea and/or vomiting:  1. The medication in the patch is effective for 72 hours, after which it should be removed.  Wrap patch in a tissue and discard in the trash. Wash hands thoroughly with soap and water. 2. You may remove the patch earlier than 72 hours if you experience unpleasant side effects which may include dry mouth, dizziness or visual disturbances. 3. Avoid touching the patch. Wash your hands with soap and water after contact with the patch.

## 2017-07-15 NOTE — Transfer of Care (Signed)
Immediate Anesthesia Transfer of Care Note  Patient: Kristine Ortega  Procedure(s) Performed: REMOVAL BREAST IMPLANTS (Bilateral Breast) PLACEMENT OF BREAST IMPLANTS (Bilateral Breast)  Patient Location: PACU  Anesthesia Type:General  Level of Consciousness: awake, alert , oriented and drowsy  Airway & Oxygen Therapy: Patient Spontanous Breathing and Patient connected to nasal cannula oxygen  Post-op Assessment: Report given to RN and Post -op Vital signs reviewed and stable  Post vital signs: Reviewed and stable  Last Vitals:  Vitals:   07/15/17 0734 07/15/17 1100  BP: 128/86 124/81  Pulse: 78 83  Resp: 18 16  Temp: 36.9 C (!) (P) 36.3 C  SpO2: 100% 100%    Last Pain:  Vitals:   07/15/17 0734  TempSrc: Oral         Complications: No apparent anesthesia complications

## 2017-07-15 NOTE — H&P (Signed)
Kristine Ortega is an 51 y.o. female.   Chief Complaint: history of breast cancer and acquired absence of breasts. HPI: The patient is a 51 y.o. yrs old wf here for exchange of her breast implants.  She underwent a bilateral mastectomy in 2005 for treatment of left breast cancer.  She had reconstruction with silicone implants and NAC by Dr. Stephanie Coup   She is 5 feet 3 inches tall, weights is 145 pounds and pre mastectomy bra size might have been 38 B. Now she is a 36 B-C.  She underwent an MRI In 2016 and this showed no suspicious enhancement in either breast.  Postoperative changes following bilateral mastectomy and subpectoral implant reconstruction. BI-RADS CATEGORY 2: Benign. The implants per Dr. Lowell Bouton notes was 450 cc mentor smooth round silicone implant.   She is concerned with the asymmetry that has developed with the left slightly larger. She would also like more projection.  She is also concerned with the age +51 years.  She is healthy and not had any recent illnesses.   Past Medical History:  Diagnosis Date  . Anxiety   . Cancer [facility])    breast cancer  . Complication of anesthesia   . Depression   . Headache(784.0)   . Motion sickness   . PONV (postoperative nausea and vomiting)     Past Surgical History:  Procedure Laterality Date  . ABDOMINAL HYSTERECTOMY  2007   BSO  . BREAST RECONSTRUCTION  2005   implants-bilat  . CESAREAN SECTION     x4  . MASTECTOMY W/ SENTINEL NODE BIOPSY  2005   left-  . PORT-A-CATH REMOVAL  2007   insertion then out  . SIMPLE MASTECTOMY  2005   right-prophalactic  . TONSILLECTOMY      Family History  Problem Relation Age of Onset  . Diabetes Mother   . Hypertension Mother   . Cancer Father        prostate  . Stroke Father    Social History:  reports that  has never smoked. she has never used smokeless tobacco. She reports that she does not drink alcohol or use drugs.  Allergies: No Known Allergies  Medications Prior to Admission   Medication Sig Dispense Refill  . conjugated estrogens (PREMARIN) vaginal cream USE AS DIRECTED 90 g 2  . lamoTRIgine (LAMICTAL) 100 MG tablet Take 1 tablet (100 mg total) by mouth 2 (two) times daily. 180 tablet 3  . NONFORMULARY OR COMPOUNDED ITEM Testosterone 5% cream- apply pea sized amount to labia 5x weekly 30 each 2  . SUMAtriptan (IMITREX) 100 MG tablet TAKE 1 TABLET (100 MG TOTAL) BY MOUTH AS NEEDED. 90 tablet 3    No results found for this or any previous visit (from the past 48 hour(s)). No results found.  Review of Systems  Constitutional: Negative.   HENT: Negative.   Eyes: Negative for blurred vision.  Respiratory: Negative.   Cardiovascular: Negative.   Gastrointestinal: Negative.   Genitourinary: Negative.   Musculoskeletal: Negative.   Skin: Negative.   Neurological: Negative.   Psychiatric/Behavioral: Negative.     Height 5\' 3"  (1.6 m), weight 65.8 kg (145 lb). Physical Exam  Constitutional: She is oriented to person, place, and time. She appears well-developed and well-nourished.  HENT:  Head: Normocephalic and atraumatic.  Eyes: Conjunctivae and EOM are normal. Pupils are equal, round, and reactive to light.  Cardiovascular: Normal rate.  Respiratory: Effort normal.  GI: Soft.  Neurological: She is alert and oriented to person,  place, and time.  Skin: Skin is warm. No erythema.  Psychiatric: She has a normal mood and affect. Her behavior is normal. Judgment and thought content normal.     Assessment/Plan Plan for removal of the breast implants and placement of new silicone implants.   Wallace Going, DO 07/15/2017, 7:14 AM

## 2017-07-15 NOTE — Anesthesia Procedure Notes (Signed)
Procedure Name: LMA Insertion Date/Time: 07/15/2017 9:21 AM Performed by: Willa Frater, CRNA Pre-anesthesia Checklist: Patient identified, Emergency Drugs available, Suction available and Patient being monitored Patient Re-evaluated:Patient Re-evaluated prior to induction Oxygen Delivery Method: Circle system utilized Preoxygenation: Pre-oxygenation with 100% oxygen Induction Type: IV induction Ventilation: Mask ventilation without difficulty LMA: LMA inserted LMA Size: 3.0 Number of attempts: 1 Airway Equipment and Method: Bite block Placement Confirmation: positive ETCO2 Tube secured with: Tape Dental Injury: Teeth and Oropharynx as per pre-operative assessment

## 2017-07-15 NOTE — Anesthesia Postprocedure Evaluation (Signed)
Anesthesia Post Note  Patient: Kristine Ortega  Procedure(s) Performed: REMOVAL BREAST IMPLANTS (Bilateral Breast) PLACEMENT OF BREAST IMPLANTS (Bilateral Breast)     Patient location during evaluation: PACU Anesthesia Type: General Level of consciousness: awake and alert Pain management: pain level controlled Vital Signs Assessment: post-procedure vital signs reviewed and stable Respiratory status: spontaneous breathing, nonlabored ventilation and respiratory function stable Cardiovascular status: blood pressure returned to baseline and stable Postop Assessment: no apparent nausea or vomiting Anesthetic complications: no    Last Vitals:  Vitals:   07/15/17 1155 07/15/17 1205  BP:  116/63  Pulse:  65  Resp:  17  Temp:  36.5 C  SpO2: 100% 100%    Last Pain:  Vitals:   07/15/17 1228  TempSrc:   PainSc: 5                  Catalina Gravel

## 2017-07-15 NOTE — Anesthesia Preprocedure Evaluation (Addendum)
Anesthesia Evaluation  Patient identified by MRN, date of birth, ID band Patient awake    Reviewed: Allergy & Precautions, NPO status , Patient's Chart, lab work & pertinent test results  History of Anesthesia Complications (+) PONV and history of anesthetic complications  Airway Mallampati: III  TM Distance: >3 FB Neck ROM: Full    Dental  (+) Teeth Intact, Dental Advisory Given   Pulmonary neg pulmonary ROS,    Pulmonary exam normal breath sounds clear to auscultation       Cardiovascular Exercise Tolerance: Good negative cardio ROS Normal cardiovascular exam Rhythm:Regular Rate:Normal     Neuro/Psych  Headaches, PSYCHIATRIC DISORDERS Anxiety Depression Bipolar Disorder    GI/Hepatic negative GI ROS, Neg liver ROS,   Endo/Other  negative endocrine ROS  Renal/GU negative Renal ROS     Musculoskeletal negative musculoskeletal ROS (+)   Abdominal   Peds  Hematology negative hematology ROS (+)   Anesthesia Other Findings Day of surgery medications reviewed with the patient.  H/o breast cancer  Reproductive/Obstetrics                            Anesthesia Physical Anesthesia Plan  ASA: II  Anesthesia Plan: General   Post-op Pain Management:    Induction: Intravenous  PONV Risk Score and Plan: 4 or greater and Ondansetron, Dexamethasone, Midazolam and Scopolamine patch - Pre-op  Airway Management Planned: LMA  Additional Equipment:   Intra-op Plan:   Post-operative Plan: Extubation in OR  Informed Consent: I have reviewed the patients History and Physical, chart, labs and discussed the procedure including the risks, benefits and alternatives for the proposed anesthesia with the patient or authorized representative who has indicated his/her understanding and acceptance.   Dental advisory given  Plan Discussed with: CRNA  Anesthesia Plan Comments: (Risks/benefits of general  anesthesia discussed with patient including risk of damage to teeth, lips, gum, and tongue, nausea/vomiting, allergic reactions to medications, and the possibility of heart attack, stroke and death.  All patient questions answered.  Patient wishes to proceed.)      Anesthesia Quick Evaluation

## 2017-07-16 ENCOUNTER — Encounter (HOSPITAL_BASED_OUTPATIENT_CLINIC_OR_DEPARTMENT_OTHER): Payer: Self-pay | Admitting: Plastic Surgery

## 2017-08-23 ENCOUNTER — Encounter: Payer: BLUE CROSS/BLUE SHIELD | Admitting: Family Medicine

## 2017-09-21 ENCOUNTER — Other Ambulatory Visit: Payer: Self-pay

## 2017-09-21 ENCOUNTER — Ambulatory Visit: Payer: BLUE CROSS/BLUE SHIELD | Admitting: Family Medicine

## 2017-09-21 ENCOUNTER — Encounter: Payer: Self-pay | Admitting: Family Medicine

## 2017-09-21 VITALS — BP 126/72 | HR 80 | Temp 98.7°F | Resp 14 | Ht 63.0 in | Wt 146.0 lb

## 2017-09-21 DIAGNOSIS — J069 Acute upper respiratory infection, unspecified: Secondary | ICD-10-CM | POA: Diagnosis not present

## 2017-09-21 DIAGNOSIS — H1031 Unspecified acute conjunctivitis, right eye: Secondary | ICD-10-CM

## 2017-09-21 MED ORDER — AZITHROMYCIN 250 MG PO TABS
ORAL_TABLET | ORAL | 0 refills | Status: DC
Start: 2017-09-21 — End: 2017-12-03

## 2017-09-21 MED ORDER — GUAIFENESIN-CODEINE 100-10 MG/5ML PO SOLN: 5.0000 mL | Freq: Four times a day (QID) | ORAL | 0 refills | Status: DC | PRN

## 2017-09-21 MED ORDER — POLYMYXIN B-TRIMETHOPRIM 10000-0.1 UNIT/ML-% OP SOLN: OPHTHALMIC | 0 refills | Status: DC

## 2017-09-21 NOTE — Patient Instructions (Signed)
Take antibiotics Eye drop Cough medicine F/u as needed

## 2017-09-21 NOTE — Progress Notes (Signed)
    Subjective:    Patient ID: Kristine Ortega, female    DOB: 26-Jul-1966, 52 y.o.   MRN: [id number]  Patient presents for Illness (x3 weeks- productive cough, low grade fever, lymph node tenderness) and Eye Irritation (x1 day- redness and drainage to B eyes)   Cough with minimal production for past 3 weeks, fever started last year, right eye draining and matted shut this mornign  COUGH is the worse, little sinus drainage is the worst.  Took nyquil. Delsym   + sick contacts , but family only had a few days, hers has lingered  No vomiting or diarrhea    Review Of Systems:  GEN- denies fatigue, fever, weight loss,weakness, recent illness HEENT- + eye drainage, change in vision, nasal discharge, CVS- denies chest pain, palpitations RESP- denies SOB, +cough, wheeze ABD- denies N/V, change in stools, abd pain GU- denies dysuria, hematuria, dribbling, incontinence MSK- denies joint pain, muscle aches, injury Neuro- denies headache, dizziness, syncope, seizure activity       Objective:    BP 126/72   Pulse 80   Temp 98.7 F (37.1 C) (Oral)   Resp 14   Ht 5\' 3"  (1.6 m)   Wt 146 lb (66.2 kg)   SpO2 100%   BMI 25.86 kg/m  GEN- NAD, alert and oriented x3 HEENT- PERRL, EOMI, Right  injected sclera, Right injected conjunctiva, left sclera white and pink conjunctiva , MMM, oropharynx mild injection, TM clear bilat no effusion, nares clear  No  maxillary sinus tenderness,  Neck- Supple, shotty right LAD CVS- RRR, no murmur RESP-CTAB EXT- No edema Pulses- Radial 2+         Assessment & Plan:      Problem List Items Addressed This Visit    None    Visit Diagnoses    Acute URI    -  Primary   URi progressive, treat with zpak, robitussin codiene   Relevant Medications   azithromycin (ZITHROMAX) 250 MG tablet   Acute bacterial conjunctivitis of right eye       Treat with polytrim drops   Relevant Medications   azithromycin (ZITHROMAX) 250 MG tablet      Note: This  dictation was prepared with Dragon dictation along with smaller phrase technology. Any transcriptional errors that result from this process are unintentional.

## 2017-10-19 LAB — HM PAP SMEAR

## 2017-11-03 ENCOUNTER — Encounter: Payer: Self-pay | Admitting: Family Medicine

## 2017-11-06 ENCOUNTER — Other Ambulatory Visit: Payer: Self-pay | Admitting: Family Medicine

## 2017-11-07 ENCOUNTER — Encounter: Payer: Self-pay | Admitting: Family Medicine

## 2017-11-08 ENCOUNTER — Other Ambulatory Visit: Payer: Self-pay | Admitting: *Deleted

## 2017-11-08 MED ORDER — LAMOTRIGINE 100 MG PO TABS: 100.0000 mg | ORAL_TABLET | Freq: Two times a day (BID) | ORAL | 0 refills | Status: DC

## 2017-11-09 ENCOUNTER — Encounter: Payer: Self-pay | Admitting: Family Medicine

## 2017-12-01 ENCOUNTER — Other Ambulatory Visit: Payer: Self-pay | Admitting: Family Medicine

## 2017-12-03 ENCOUNTER — Other Ambulatory Visit: Payer: Self-pay

## 2017-12-03 ENCOUNTER — Encounter: Payer: Self-pay | Admitting: Family Medicine

## 2017-12-03 ENCOUNTER — Ambulatory Visit: Payer: BLUE CROSS/BLUE SHIELD | Admitting: Family Medicine

## 2017-12-03 VITALS — BP 120/62 | HR 68 | Temp 98.8°F | Resp 12 | Ht 63.0 in | Wt 148.0 lb

## 2017-12-03 DIAGNOSIS — G43709 Chronic migraine without aura, not intractable, without status migrainosus: Secondary | ICD-10-CM

## 2017-12-03 DIAGNOSIS — W57XXXA Bitten or stung by nonvenomous insect and other nonvenomous arthropods, initial encounter: Secondary | ICD-10-CM | POA: Diagnosis not present

## 2017-12-03 DIAGNOSIS — F3181 Bipolar II disorder: Secondary | ICD-10-CM | POA: Diagnosis not present

## 2017-12-03 MED ORDER — LAMOTRIGINE 100 MG PO TABS: ORAL_TABLET | ORAL | 3 refills | Status: DC

## 2017-12-03 MED ORDER — SUMATRIPTAN SUCCINATE 100 MG PO TABS: ORAL_TABLET | ORAL | 3 refills | Status: DC

## 2017-12-03 NOTE — Assessment & Plan Note (Signed)
Continue lamictal as prescribed ,doing well

## 2017-12-03 NOTE — Patient Instructions (Signed)
Schedule physical  July/August

## 2017-12-03 NOTE — Assessment & Plan Note (Signed)
Continue imitrex

## 2017-12-03 NOTE — Progress Notes (Signed)
   Subjective:    Patient ID: Kristine Ortega, female    DOB: [birth year], 52 y.o.   MRN: [id number]  Patient presents for Follow-up (is not fasting) and Tick Bite (found tick on R shoulder- removed tick and would like area assessed)     Dr. Sarajane Jews removed [place] from right ear    Bipolar- doing well on lamictal   Migraines- taking imitrex 2-3 times a week,during certain times a year,otherwise doing well , does not want to use a preventative medication   Using estrogen cream per her GYN   Has f/u in 1 year with plastic surgery for breast recontruction , she does not get mammograms any further   Tick bite- pulled off last off right shoulder , no redness, mild itching, does not feel it was attached all the way       Review Of Systems:  GEN- denies fatigue, fever, weight loss,weakness, recent illness HEENT- denies eye drainage, change in vision, nasal discharge, CVS- denies chest pain, palpitations RESP- denies SOB, cough, wheeze ABD- denies N/V, change in stools, abd pain GU- denies dysuria, hematuria, dribbling, incontinence MSK- denies joint pain, muscle aches, injury Neuro- denies headache, dizziness, syncope, seizure activity       Objective:    BP 120/62   Pulse 68   Temp 98.8 F (37.1 C) (Oral)   Resp 12   Ht 5\' 3"  (1.6 m)   Wt 148 lb (67.1 kg)   SpO2 98%   BMI 26.22 kg/m  GEN- NAD, alert and oriented x3 CVS- RRR, no murmur RESP-CTAB Psych- very pleasant, normal affect and mood Skin- no bulls eye, no significant erythema, no tick seen, multiple skin moles/freckles on  Back  EXT- No edema Pulses- Radial  2+        Assessment & Plan:      Problem List Items Addressed This Visit      Unprioritized   Migraines    Continue imitrex      Relevant Medications   SUMAtriptan (IMITREX) 100 MG tablet   lamoTRIgine (LAMICTAL) 100 MG tablet   Bipolar II disorder (HCC) - Primary    Continue lamictal as prescribed ,doing well        Other Visit Diagnoses     Tick bite, initial encounter       no sign of lyme or infection, continue to watch for next week, call , if any symptoms will treat with doxycycline. Currently on levaquin s/p ear surgery      Note: This dictation was prepared with Dragon dictation along with smaller phrase technology. Any transcriptional errors that result from this process are unintentional.

## 2018-01-14 ENCOUNTER — Other Ambulatory Visit: Payer: Self-pay | Admitting: Family Medicine

## 2018-01-14 NOTE — Telephone Encounter (Signed)
Ok to refill 

## 2018-02-23 ENCOUNTER — Ambulatory Visit: Payer: BLUE CROSS/BLUE SHIELD | Admitting: Family Medicine

## 2018-02-23 ENCOUNTER — Other Ambulatory Visit: Payer: Self-pay

## 2018-02-23 ENCOUNTER — Encounter: Payer: Self-pay | Admitting: Family Medicine

## 2018-02-23 VITALS — BP 118/62 | HR 66 | Temp 98.5°F | Resp 14 | Ht 63.0 in | Wt 147.0 lb

## 2018-02-23 DIAGNOSIS — W57XXXS Bitten or stung by nonvenomous insect and other nonvenomous arthropods, sequela: Secondary | ICD-10-CM

## 2018-02-23 DIAGNOSIS — L03113 Cellulitis of right upper limb: Secondary | ICD-10-CM

## 2018-02-23 MED ORDER — CEPHALEXIN 500 MG PO CAPS: 500.0000 mg | ORAL_CAPSULE | Freq: Two times a day (BID) | ORAL | 0 refills | Status: DC

## 2018-02-23 MED ORDER — PREDNISONE 20 MG PO TABS: 20.0000 mg | ORAL_TABLET | Freq: Every day | ORAL | 0 refills | Status: DC

## 2018-02-23 NOTE — Patient Instructions (Signed)
Take antibiotics as steroids Keep clean/covered Call if not improving

## 2018-02-23 NOTE — Progress Notes (Signed)
   Subjective:    Patient ID: Kristine Ortega, female    DOB: 12-May-1966, 52 y.o.   MRN: [id number]  Patient presents for Rash (x4 days- R inner elbow area- red and blistered and weeping- slight itching)  She here with rash to her right antecubital fossa.  States that she was bitten by something she is not sure [place]  She initially had some redness and a blistered spot that has now spread she has more blistered areas and now the redness is spreading beyond that.  She has not had any fever chills no joint pain at the elbow no swelling.  She did use some type of topical anti-itch which help with the itching.  Also pulled a tick off of her right lower leg yesterday    Review Of Systems:  GEN- denies fatigue, fever, weight loss,weakness, recent illness HEENT- denies eye drainage, change in vision, nasal discharge, CVS- denies chest pain, palpitations RESP- denies SOB, cough, wheeze ABD- denies N/V, change in stools, abd pain GU- denies dysuria, hematuria, dribbling, incontinence MSK- denies joint pain, muscle aches, injury Neuro- denies headache, dizziness, syncope, seizure activity       Objective:    BP 118/62   Pulse 66   Temp 98.5 F (36.9 C) (Oral)   Resp 14   Ht 5\' 3"  (1.6 m)   Wt 147 lb (66.7 kg)   SpO2 98%   BMI 26.04 kg/m  GEN- NAD, alert and oriented x3 Lymph nodes- no axillary nodes  MSK- FROM wrist/ elbow Skin- moderate patch of small blisters group together with erythema extending 1 inch around it, NT, clear drainage from blisters, no odor  EXT- No edema Pulses- Radial2+        Assessment & Plan:      Problem List Items Addressed This Visit    None    Visit Diagnoses    Bug bite, sequela    -  Primary   Unknown bug bites, but also signs of cellulitis, will treat wtih prednisone/keflex, keep area clean and dry. Discussed red flags, no fever or systemic symptoms   Cellulitis of right upper extremity          Note: This dictation  was prepared with Dragon dictation along with smaller phrase technology. Any transcriptional errors that result from this process are unintentional.

## 2018-06-09 ENCOUNTER — Telehealth: Payer: Self-pay | Admitting: Plastic Surgery

## 2018-06-09 NOTE — Telephone Encounter (Signed)
Patient had a tattoo procedure in office at previous practice and needs letter of Medical necessity faxed to her insurance provider before she is sent to collections  Fax: [phone] Claim # [id number]

## 2018-06-14 ENCOUNTER — Encounter: Payer: Self-pay | Admitting: Plastic Surgery

## 2018-06-15 ENCOUNTER — Telehealth: Payer: Self-pay | Admitting: Plastic Surgery

## 2018-06-15 NOTE — Telephone Encounter (Signed)
Patient states that she needs a letter of medical necessity for her insurance company as well as documentation to show that her procedure was due to breast cancer and not cosmetic. Patient says that procedure was initially done on 11/19/2017. Patient also requesting a call back.  Insurance fax number is [phone]; Claim number [id number]

## 2018-06-16 NOTE — Telephone Encounter (Signed)
Called patient to confirm that fax of letter of necessity would be sent to insurance today. Patient had no other needs at this time.

## 2018-06-17 NOTE — Telephone Encounter (Signed)
Letter and patient records faxed to insurance

## 2018-07-28 ENCOUNTER — Encounter: Payer: Self-pay | Admitting: Family Medicine

## 2018-08-09 ENCOUNTER — Other Ambulatory Visit: Payer: Self-pay | Admitting: *Deleted

## 2018-08-09 MED ORDER — SUMATRIPTAN SUCCINATE 100 MG PO TABS: ORAL_TABLET | ORAL | 3 refills | Status: DC

## 2018-08-09 MED ORDER — ESTROGENS, CONJUGATED 0.625 MG/GM VA CREA: TOPICAL_CREAM | VAGINAL | 2 refills | Status: DC

## 2018-08-09 MED ORDER — LAMOTRIGINE 100 MG PO TABS: ORAL_TABLET | ORAL | 3 refills | Status: DC

## 2018-09-14 ENCOUNTER — Ambulatory Visit: Payer: BLUE CROSS/BLUE SHIELD

## 2018-11-10 ENCOUNTER — Other Ambulatory Visit: Payer: Self-pay | Admitting: *Deleted

## 2018-11-10 NOTE — Telephone Encounter (Signed)
Received call from patient.   Requested refill on Testosterone HRT Cream.   Ok to refill?

## 2018-11-11 ENCOUNTER — Encounter: Payer: Self-pay | Admitting: *Deleted

## 2018-11-11 MED ORDER — HRT BASE CREA: TOPICAL_CREAM | 1 refills | Status: DC

## 2018-11-11 NOTE — Telephone Encounter (Signed)
Letter sent.

## 2018-11-11 NOTE — Telephone Encounter (Signed)
Pt needs OV for Physical and f/u medications

## 2018-11-22 ENCOUNTER — Ambulatory Visit: Payer: BLUE CROSS/BLUE SHIELD | Admitting: Family Medicine

## 2018-11-23 ENCOUNTER — Ambulatory Visit: Payer: BLUE CROSS/BLUE SHIELD | Admitting: Family Medicine

## 2018-11-24 ENCOUNTER — Telehealth: Payer: Self-pay | Admitting: Family Medicine

## 2018-11-24 MED ORDER — HRT BASE CREA: TOPICAL_CREAM | 1 refills | Status: DC

## 2018-11-24 NOTE — Telephone Encounter (Signed)
Needs refill on testosterone cream 36ml to [facility]

## 2018-11-24 NOTE — Telephone Encounter (Signed)
Pt needs refill on medication did not state which one or which pharmacy I called her back and had to lvmtrc

## 2018-11-24 NOTE — Telephone Encounter (Signed)
Patient requesting refill. Last refill on Testosterone was on 11/11/2018. Patient was told needs office visit. Please advise?

## 2018-11-24 NOTE — Telephone Encounter (Signed)
Will give  refill , she can schedule for end of April The cream was sent

## 2018-11-25 NOTE — Telephone Encounter (Signed)
Spoke with patient and informed her of that refill was sent in. Patient scheduled appointment for 01/02/19.

## 2018-12-02 ENCOUNTER — Encounter: Payer: Self-pay | Admitting: Family Medicine

## 2018-12-02 MED ORDER — HRT BASE CREA: TOPICAL_CREAM | 1 refills | Status: DC

## 2018-12-02 NOTE — Telephone Encounter (Signed)
Prescription was sent to [facility] instead of [place] for compounding.   Please resubmit.

## 2019-01-02 ENCOUNTER — Ambulatory Visit: Payer: Self-pay | Admitting: Family Medicine

## 2019-01-07 ENCOUNTER — Encounter: Payer: Self-pay | Admitting: Family Medicine

## 2019-01-09 ENCOUNTER — Encounter: Payer: Self-pay | Admitting: *Deleted

## 2019-01-10 ENCOUNTER — Encounter: Payer: Self-pay | Admitting: Family Medicine

## 2019-01-10 ENCOUNTER — Other Ambulatory Visit: Payer: Self-pay

## 2019-01-10 ENCOUNTER — Ambulatory Visit (INDEPENDENT_AMBULATORY_CARE_PROVIDER_SITE_OTHER): Payer: BLUE CROSS/BLUE SHIELD | Admitting: Family Medicine

## 2019-01-10 DIAGNOSIS — N951 Menopausal and female climacteric states: Secondary | ICD-10-CM | POA: Diagnosis not present

## 2019-01-10 DIAGNOSIS — F411 Generalized anxiety disorder: Secondary | ICD-10-CM | POA: Diagnosis not present

## 2019-01-10 DIAGNOSIS — F3181 Bipolar II disorder: Secondary | ICD-10-CM | POA: Diagnosis not present

## 2019-01-10 DIAGNOSIS — G43709 Chronic migraine without aura, not intractable, without status migrainosus: Secondary | ICD-10-CM

## 2019-01-10 NOTE — Progress Notes (Signed)
Virtual Visit via Telephone Note  I connected with Kristine Ortega on 01/10/19 at 9:13am by telephone and verified that I am speaking with the correct person using two identifiers.    Pt location:     I discussed the limitations, risks, security and privacy concerns of performing an evaluation and management service by telephone and the availability of in person appointments. I also discussed with the patient that there may be a patient responsible charge related to this service. The patient expressed understanding and agreed to proceed.   History of Present Illness: Doing well no concerns, taking all meds as prescribed  Bipolar disorder- taking lamictal 100mg  twice a day feels good on the medication if it was possible her to come off of the medication or if she should just stay.  She did admit that she has some increased anxiety a couple months ago she started using  Rhodiola helps.  She is also on melatonin 5mg  at bedtime as needed helps with her sleep   Atrophic vaginitis-menopausal she continues to use Premarin vaginal as well as testosterone cream without any difficulties.  She was following up with GYN would like to change her care to this office.  Will obtain records.  Migraines- usually only needs during season changes, pressure changes, does not use all the time   uses imitrex 50mg  most times for migraines         Observations/Objective: No acute distress noted over the telephone  Assessment and Plan: Bipolar disorder I recommend that she continue with the Lamictal in the setting of the COVID and pandemic she has had some anxiety episodes the past couple months I would be afraid that she would decompensate and have increased anxiety where she was before.  We can discuss this again in the fall when she comes in for her physical to see if we could decrease the dose.  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 The patient was provided an opportunity to ask questions and all were answered. The patient agreed with the plan and demonstrated an understanding of the instructions.   The patient was advised to call back or seek an in-person evaluation if the symptoms worsen or if the condition fails to improve as anticipated.  I provided 10 minutes of non-face-to-face time during this encounter. End time 9:23   Vic Blackbird, MD

## 2019-01-19 ENCOUNTER — Encounter: Payer: Self-pay | Admitting: *Deleted

## 2019-01-23 ENCOUNTER — Encounter: Payer: Self-pay | Admitting: Family Medicine

## 2019-04-11 ENCOUNTER — Encounter: Payer: Self-pay | Admitting: Family Medicine

## 2019-04-11 ENCOUNTER — Other Ambulatory Visit: Payer: Self-pay | Admitting: *Deleted

## 2019-04-11 DIAGNOSIS — Z13820 Encounter for screening for osteoporosis: Secondary | ICD-10-CM

## 2019-04-11 DIAGNOSIS — Z780 Asymptomatic menopausal state: Secondary | ICD-10-CM

## 2019-05-24 ENCOUNTER — Other Ambulatory Visit: Payer: Self-pay

## 2019-05-24 ENCOUNTER — Ambulatory Visit (HOSPITAL_COMMUNITY)
Admission: RE | Admit: 2019-05-24 | Discharge: 2019-05-24 | Disposition: A | Discharge status: Home / Self Care | Payer: BC Managed Care – PPO | Source: Ambulatory Visit | Attending: Family Medicine | Admitting: Family Medicine

## 2019-05-24 DIAGNOSIS — Z13820 Encounter for screening for osteoporosis: Secondary | ICD-10-CM | POA: Insufficient documentation

## 2019-05-24 DIAGNOSIS — Z780 Asymptomatic menopausal state: Secondary | ICD-10-CM | POA: Insufficient documentation

## 2019-05-25 ENCOUNTER — Encounter: Payer: Self-pay | Admitting: Family Medicine

## 2019-06-17 ENCOUNTER — Encounter: Payer: Self-pay | Admitting: Family Medicine

## 2019-06-19 MED ORDER — NITROFURANTOIN MONOHYD MACRO 100 MG PO CAPS: 100.0000 mg | ORAL_CAPSULE | Freq: Two times a day (BID) | ORAL | 0 refills | Status: AC

## 2019-10-01 ENCOUNTER — Encounter: Payer: Self-pay | Admitting: Family Medicine

## 2019-10-04 ENCOUNTER — Ambulatory Visit: Payer: BC Managed Care – PPO | Admitting: Family Medicine

## 2019-10-04 ENCOUNTER — Encounter: Payer: Self-pay | Admitting: Family Medicine

## 2019-10-04 ENCOUNTER — Other Ambulatory Visit: Payer: Self-pay

## 2019-10-04 VITALS — BP 118/68 | HR 60 | Temp 98.2°F | Resp 14 | Ht 63.0 in | Wt 143.0 lb

## 2019-10-04 DIAGNOSIS — F411 Generalized anxiety disorder: Secondary | ICD-10-CM | POA: Diagnosis not present

## 2019-10-04 DIAGNOSIS — F3181 Bipolar II disorder: Secondary | ICD-10-CM

## 2019-10-04 MED ORDER — LAMOTRIGINE 25 MG PO TABS: 25.0000 mg | ORAL_TABLET | Freq: Two times a day (BID) | ORAL | 1 refills | Status: DC

## 2019-10-04 NOTE — Progress Notes (Signed)
Subjective:    Patient ID: Kristine Ortega, female    DOB: 09-Mar-1966, 54 y.o.   MRN: [id number]  Patient presents for Anxiety (multiple stressors that have been building up)  Patient sent my chart message with increased anxiety want to know she need to adjust her medication.  She does have history anxiety disorder along with bipolar 2 diagnosis given psychiatry- Dr. Lita Mains a few years ago.  She has been maintained on Lamictal 100 mg twice a day. I have been prescribing Lamictal as she has been stable on the dose. -She was also on alprazolam as needed as well as multiple other medications including Effexor, Zoloft, Prozac, Brintellix, abilify, Lexapro, Seroquel, trazodone, Remeron and lorazepam  Past 2 months she has had increasing anxiety.  She felt herself trembling at times.  She has not been sleeping well her mind is racing at nighttime she does not seem to be able to shut it off.  Initially she had increased anxiety with the Covid pandemic and staying within her home then the political distress every time she would turn on the news or look at TV.  She then had a new grandbaby brought into the family which she has been excited about but still has some worry.  Then her grandmother felt sick and actually passed away they have a funeral week ago.  And then she came down with Covid symptoms herself back in December.  She thinks everything just kind of toppled on her at one time.  She did try taking a fourth of 0.5 mg of an old Xanax but that only made her groggy did not help with her anxiety.  She is also been taking Benadryl along with her melatonin which does help her get to sleep but she does not stay asleep.  She is overdue for fasting labs  Review Of Systems:  GEN- denies fatigue, fever, weight loss,weakness, recent illness HEENT- denies eye drainage, change in vision, nasal discharge, CVS- denies chest pain, palpitations RESP- denies SOB, cough, wheeze ABD- denies N/V, change in stools, abd  pain GU- denies dysuria, hematuria, dribbling, incontinence MSK- denies joint pain, muscle aches, injury Neuro- denies headache, dizziness, syncope, seizure activity       Objective:    BP 118/68   Pulse 60   Temp 98.2 F (36.8 C) (Temporal)   Resp 14   Ht 5\' 3"  (1.6 m)   Wt 143 lb (64.9 kg)   SpO2 95%   BMI 25.33 kg/m  GEN- NAD, alert and oriented x3 HEENT- PERRL, EOMI, non injected sclera, pink conjunctiva, MMM, oropharynx clear Neck- Supple, no thyromegaly CVS- RRR, no murmur RESP-CTAB ABD-NABS,soft,NT,ND Psych anxious appearing not overly depressed good eye contact normal speech well-groomed EXT- No edema Pulses- Radial, DP- 2+   GAD-7 score 11 PHQ-9 score 9 no suicidal ideation     Assessment & Plan:      Problem List Items Addressed This Visit      Unprioritized   Bipolar II disorder ([place]) - Primary    Bipolar 2 disorder along with generalized anxiety.  She has been on multiple medications.  We discussed her options.  At this time she does not want to add an additional medication and see we can maximize Lamictal to help her symptoms this is the best medication she has been on.  We will slowly increase her Lamictal.  We will increase her to 125 mg at bedtime to see if this helps with the mind racing at nighttime.  After  2 weeks if she is still having issues she can add another 25 mg in the morning so she will be on 125 mg twice a day.  We will follow-up in 3 weeks to see how she is doing.  She will continue the melatonin at this time.      GAD (generalized anxiety disorder)      Note: This dictation was prepared with Dragon dictation along with smaller phrase technology. Any transcriptional errors that result from this process are unintentional.

## 2019-10-04 NOTE — Patient Instructions (Signed)
Add lamictal 25mg  to your evening dose for 2 weeks If not controlled, then add the morning dose (25mg )  Continue melatonin F/U 3 weeks- Morning appt for fasting labs and medication f/u

## 2019-10-04 NOTE — Assessment & Plan Note (Signed)
Bipolar 2 disorder along with generalized anxiety.  She has been on multiple medications.  We discussed her options.  At this time she does not want to add an additional medication and see we can maximize Lamictal to help her symptoms this is the best medication she has been on.  We will slowly increase her Lamictal.  We will increase her to 125 mg at bedtime to see if this helps with the mind racing at nighttime.  After 2 weeks if she is still having issues she can add another 25 mg in the morning so she will be on 125 mg twice a day.  We will follow-up in 3 weeks to see how she is doing.  She will continue the melatonin at this time.

## 2019-10-09 ENCOUNTER — Other Ambulatory Visit: Payer: Self-pay | Admitting: Family Medicine

## 2019-10-12 ENCOUNTER — Telehealth: Payer: Self-pay | Admitting: Family Medicine

## 2019-10-12 MED ORDER — LAMOTRIGINE 100 MG PO TABS: ORAL_TABLET | ORAL | 0 refills | Status: DC

## 2019-10-12 NOTE — Telephone Encounter (Signed)
Prescription sent to pharmacy.   Call placed to patient and patient made aware.   Of note, patient reports that the extra dose of Lamictal is helping her tremendously.

## 2019-10-12 NOTE — Telephone Encounter (Signed)
Patient is going to run out of her lamotrigine before her mail order can get it to her would like to know if it can be called into [facility]

## 2019-10-13 NOTE — Telephone Encounter (Signed)
Noted  

## 2019-10-25 ENCOUNTER — Other Ambulatory Visit: Payer: Self-pay

## 2019-10-25 ENCOUNTER — Encounter: Payer: Self-pay | Admitting: Family Medicine

## 2019-10-25 ENCOUNTER — Ambulatory Visit: Payer: BC Managed Care – PPO | Admitting: Family Medicine

## 2019-10-25 VITALS — BP 112/68 | HR 74 | Temp 98.6°F | Resp 16 | Ht 63.0 in | Wt 143.0 lb

## 2019-10-25 DIAGNOSIS — F411 Generalized anxiety disorder: Secondary | ICD-10-CM

## 2019-10-25 DIAGNOSIS — E785 Hyperlipidemia, unspecified: Secondary | ICD-10-CM | POA: Diagnosis not present

## 2019-10-25 DIAGNOSIS — F3181 Bipolar II disorder: Secondary | ICD-10-CM | POA: Diagnosis not present

## 2019-10-25 MED ORDER — LORAZEPAM 0.5 MG PO TABS: 0.5000 mg | ORAL_TABLET | Freq: Two times a day (BID) | ORAL | 1 refills | Status: DC | PRN

## 2019-10-25 NOTE — Patient Instructions (Signed)
F/U  3 months 

## 2019-10-25 NOTE — Progress Notes (Signed)
Subjective:    Patient ID: Kristine Ortega, female    DOB: 1966/06/30, 54 y.o.   MRN: [id number]  Patient presents for Medication F/U (reports improvement)   Pt here to f/u last visit.  At our last visit her lamictal was increased secondary to worsening mood symptoms.  She initially started 125 mg at bedtime but then went to 125 mg twice a day approximately 2 weeks ago.  She can tell she is sleeping better she has had less anxiety.  Does still use old lorazepam that she had.  She typically will take a half a tablet of the 0.5 mg in the morning if her anxiety is still bad midday she may take the other one.  She does request a refill on this.  Looking back it was last refilled a few years ago.  she feels this dose is working much better for her.  She is concerned however she will not be able to work because of her bipolar and anxiety.  She states that she tried to get a part-time job at a [employer].  After being there for just a few hours she went home and had a nervous breakdown.  States she was crying to her friend she could not concentrate she could not focus and even though she had excepted the job she had to call back and tell them that she would not be able to do it.  She is thinking about signing up for disability.   Due for fasting labs today.  We discussed this at the last visit.  She has history of elevated cholesterol which is not been checked since 2017.  Also due to her medication she needs CBC and metabolic panel.    Review Of Systems:  GEN- denies fatigue, fever, weight loss,weakness, recent illness HEENT- denies eye drainage, change in vision, nasal discharge, CVS- denies chest pain, palpitations RESP- denies SOB, cough, wheeze ABD- denies N/V, change in stools, abd pain GU- denies dysuria, hematuria, dribbling, incontinence MSK- denies joint pain, muscle aches, injury Neuro- denies headache, dizziness, syncope, seizure activity       Objective:    BP 112/68    Pulse 74   Temp 98.6 F (37 C) (Temporal)   Resp 16   Ht 5\' 3"  (1.6 m)   Wt 143 lb (64.9 kg)   SpO2 98%   BMI 25.33 kg/m  GEN- NAD, alert and oriented x3 HEENT- PERRL, EOMI, non injected sclera, pink conjunctiva, MMM, oropharynx clear Neck- Supple, no thyromegaly CVS- RRR, no murmur RESP-CTAB Psych normal affect and mood good eye contact pleasant no apparent hallucinations suicidal ideations GAD 7 score  9, on 1/27  Score was 11  PHQ score 9,  On 1/27 score was 4      Assessment & Plan:      Problem List Items Addressed This Visit      Unprioritized   Bipolar II disorder ([facility]) - Primary    Continue Lamictal at 125 mg twice a day.  If she has any concerns with her mood she will let me know.  I have refilled the lorazepam 0.5 mg.  To her anxiety along with her bipolar working in the general public does seem to be very difficult for her.  She is going to pursue disability      Relevant Orders   TSH   GAD (generalized anxiety disorder)   Relevant Medications   LORazepam (ATIVAN) 0.5 MG tablet   Hyperlipidemia   Relevant Orders  CBC with Differential/Platelet   Comprehensive metabolic panel   Lipid panel      Note: This dictation was prepared with Dragon dictation along with smaller phrase technology. Any transcriptional errors that result from this process are unintentional.

## 2019-10-25 NOTE — Assessment & Plan Note (Signed)
Continue Lamictal at 125 mg twice a day.  If she has any concerns with her mood she will let me know.  I have refilled the lorazepam 0.5 mg.  To her anxiety along with her bipolar working in the general public does seem to be very difficult for her.  She is going to pursue disability

## 2019-10-26 LAB — CBC WITH DIFFERENTIAL/PLATELET
Absolute Monocytes: 378 cells/uL (ref 200–950)
Basophils Absolute: 31 cells/uL (ref 0–200)
Basophils Relative: 0.7 %
Eosinophils Absolute: 9 cells/uL — ABNORMAL LOW (ref 15–500)
Eosinophils Relative: 0.2 %
HCT: 43 % (ref 35.0–45.0)
Hemoglobin: 14.2 g/dL (ref 11.7–15.5)
Lymphs Abs: 1452 cells/uL (ref 850–3900)
MCH: 28.3 pg (ref 27.0–33.0)
MCHC: 33 g/dL (ref 32.0–36.0)
MCV: 85.7 fL (ref 80.0–100.0)
MPV: 10.8 fL (ref 7.5–12.5)
Monocytes Relative: 8.6 %
Neutro Abs: 2530 cells/uL (ref 1500–7800)
Neutrophils Relative %: 57.5 %
Platelets: 269 10*3/uL (ref 140–400)
RBC: 5.02 10*6/uL (ref 3.80–5.10)
RDW: 12.4 % (ref 11.0–15.0)
Total Lymphocyte: 33 %
WBC: 4.4 10*3/uL (ref 3.8–10.8)

## 2019-10-26 LAB — COMPREHENSIVE METABOLIC PANEL
AG Ratio: 2 (calc) (ref 1.0–2.5)
ALT: 13 U/L (ref 6–29)
AST: 16 U/L (ref 10–35)
Albumin: 4.7 g/dL (ref 3.6–5.1)
Alkaline phosphatase (APISO): 52 U/L (ref 37–153)
BUN: 19 mg/dL (ref 7–25)
CO2: 27 mmol/L (ref 20–32)
Calcium: 9.9 mg/dL (ref 8.6–10.4)
Chloride: 103 mmol/L (ref 98–110)
Creat: 0.86 mg/dL (ref 0.50–1.05)
Globulin: 2.4 g/dL (calc) (ref 1.9–3.7)
Glucose, Bld: 87 mg/dL (ref 65–99)
Potassium: 4.2 mmol/L (ref 3.5–5.3)
Sodium: 141 mmol/L (ref 135–146)
Total Bilirubin: 0.4 mg/dL (ref 0.2–1.2)
Total Protein: 7.1 g/dL (ref 6.1–8.1)

## 2019-10-26 LAB — LIPID PANEL
Cholesterol: 302 mg/dL — ABNORMAL HIGH (ref <200)
HDL: 50 mg/dL (ref >=50)
LDL Cholesterol (Calc): 222 mg/dL (calc) — ABNORMAL HIGH
Non-HDL Cholesterol (Calc): 252 mg/dL (calc) — ABNORMAL HIGH (ref <130)
Total CHOL/HDL Ratio: 6 (calc) — ABNORMAL HIGH (ref <5.0)
Triglycerides: 146 mg/dL (ref <150)

## 2019-10-26 LAB — TSH: TSH: 1.04 mIU/L

## 2019-11-07 ENCOUNTER — Other Ambulatory Visit: Payer: Self-pay | Admitting: Family Medicine

## 2019-11-10 ENCOUNTER — Encounter: Payer: Self-pay | Admitting: Family Medicine

## 2019-11-10 MED ORDER — LAMOTRIGINE 100 MG PO TABS: 150.0000 mg | ORAL_TABLET | Freq: Two times a day (BID) | ORAL | 0 refills | Status: DC

## 2019-11-10 MED ORDER — HRT BASE CREA: TOPICAL_CREAM | 11 refills | Status: DC

## 2019-11-10 NOTE — Addendum Note (Signed)
Addended by: Sheral Flow on: 11/10/2019 02:43 PM   Modules accepted: Orders

## 2019-11-10 NOTE — Telephone Encounter (Signed)
Ok to refill 

## 2019-11-10 NOTE — Telephone Encounter (Signed)
Call pt, She can increase to  150mg  twice a day  She is currently on 125mg  BID  Needs visit in 3 weeks- okay to be telehealth video visit  If this is not helping we will need to get her re-established with psychiatry since I dont feel comfortable going over 300mg  a day of lamictal

## 2019-11-28 ENCOUNTER — Encounter: Payer: Self-pay | Admitting: Family Medicine

## 2019-11-28 ENCOUNTER — Other Ambulatory Visit: Payer: Self-pay

## 2019-11-28 ENCOUNTER — Ambulatory Visit: Payer: BC Managed Care – PPO | Admitting: Family Medicine

## 2019-11-28 DIAGNOSIS — E7801 Familial hypercholesterolemia: Secondary | ICD-10-CM

## 2019-11-28 DIAGNOSIS — F3181 Bipolar II disorder: Secondary | ICD-10-CM | POA: Diagnosis not present

## 2019-11-28 MED ORDER — LAMOTRIGINE 100 MG PO TABS: ORAL_TABLET | ORAL | 1 refills | Status: DC

## 2019-11-28 NOTE — Assessment & Plan Note (Signed)
Symptoms are stabilized 150 mg of Lamictal twice a day.  We will continue the as needed lorazepam she is only using 0.5 mg total a day.  Her sleep is good appetite is good and she is down doing some activities including crocheting.  He will let me know if he feels like her medication is not helping with her anxiety at that point I will call in psychiatry but she is at the maximal dose of Lamictal that I feel comfortable with.

## 2019-11-28 NOTE — Patient Instructions (Signed)
F/U 4 months  

## 2019-11-28 NOTE — Assessment & Plan Note (Signed)
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  And family history.  We did discuss statin drug but she wants to hold off on this.  We discussed red yeast rice with natural dietary supplements she is going to consider trying this.  We will plan to recheck her labs in 3 to 4 months

## 2019-11-28 NOTE — Progress Notes (Signed)
   Subjective:    Patient ID: Kristine Ortega, female    DOB: 1966-03-27, 54 y.o.   MRN: [id number]  Patient presents for Follow-up (is not fasting) Patient here to follow-up medications. We have been corresponding via MyChart for the past month.  Her Lamictal was increased to 150 mg twice a day on March 5.  After first initial dose dose adjustment she felt like things were improving but then her anxiety was still not controlled so she asked for another increase.  Now at her current dose she feels like things are stabilizing.  She has been crocheting and doing some activities for herself.  Her son is no longer working from home so she is at home a lot by herself she thinks that was the source or her anxiety as well but she is now coping much better with that.  She is only using alprazolam 0.5 mg total a day.  She typically will take a half a tablet in the morning if it is what she calls a "bad" day she will take the second dose in the evening.. She has not had any side effects with the increased dose of Lamictal   Hyperlipidemia she has known consistent hyperlipidemia.  Recommend she take statin drug but she has declined.  Her LDL is 222 total cholesterol 302  Review Of Systems:  GEN- denies fatigue, fever, weight loss,weakness, recent illness HEENT- denies eye drainage, change in vision, nasal discharge, CVS- denies chest pain, palpitations RESP- denies SOB, cough, wheeze ABD- denies N/V, change in stools, abd pain GU- denies dysuria, hematuria, dribbling, incontinence MSK- denies joint pain, muscle aches, injury Neuro- denies headache, dizziness, syncope, seizure activity       Objective:    BP 116/80   Pulse 78   Temp 98.2 F (36.8 C) (Temporal)   Resp 16   Ht 5\' 3"  (1.6 m)   Wt 142 lb (64.4 kg)   SpO2 98%   BMI 25.15 kg/m  GEN- NAD, alert and oriented x3 HEENT- PERRL, EOMI, non injected sclera, pink conjunctiva,  Neck- Supple, no thyromegaly CVS- RRR, no  murmur RESP-CTAB Psych- normal affect and mood        Assessment & Plan:      Problem List Items Addressed This Visit      Unprioritized   Bipolar II disorder ([facility])    Symptoms are stabilized 150 mg of Lamictal twice a day.  We will continue the as needed lorazepam she is only using 0.5 mg total a day.  Her sleep is good appetite is good and she is down doing some activities including crocheting.  He will let me know if he feels like her medication is not helping with her anxiety at that point I will call in psychiatry but she is at the maximal dose of Lamictal that I feel comfortable with.      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  And family history.  We did discuss statin drug but she wants to hold off on this.  We discussed red yeast rice with natural dietary supplements she is going to consider trying this.  We will plan to recheck her labs in 3 to 4 months         Note: This dictation was prepared with Dragon dictation along with smaller phrase technology. Any transcriptional errors that result from this process are unintentional.

## 2019-12-04 ENCOUNTER — Other Ambulatory Visit: Payer: Self-pay | Admitting: *Deleted

## 2019-12-04 MED ORDER — LAMOTRIGINE 100 MG PO TABS: 150.0000 mg | ORAL_TABLET | Freq: Two times a day (BID) | ORAL | 1 refills | Status: DC

## 2020-02-06 ENCOUNTER — Other Ambulatory Visit: Payer: Self-pay | Admitting: Family Medicine

## 2020-02-06 NOTE — Telephone Encounter (Signed)
Ok to refill??  Last office visit 11/28/2019.  Last refill 10/25/2019.

## 2020-02-13 ENCOUNTER — Encounter: Payer: Self-pay | Admitting: Family Medicine

## 2020-02-13 MED ORDER — PREDNISONE 10 MG PO TABS: ORAL_TABLET | ORAL | 0 refills | Status: DC

## 2020-02-13 MED ORDER — TRIAMCINOLONE ACETONIDE 0.1 % EX CREA: 1.0000 "application " | TOPICAL_CREAM | Freq: Two times a day (BID) | CUTANEOUS | 0 refills | Status: DC

## 2020-02-13 NOTE — Telephone Encounter (Signed)
Agree with below. Addendum:    I believe that she has limits with moving and including, toileting, bathing, feeding, dressing and grooming. I believe the power wheelchair is needed for pt to be able to perform ADL's in her home

## 2020-02-13 NOTE — Telephone Encounter (Signed)
Call placed to patient.   Reports that she has multiple areas on her arms, back, legs, and face.   Agreeable to prednisone and triamcinolone.

## 2020-04-20 ENCOUNTER — Other Ambulatory Visit: Payer: Self-pay | Admitting: Family Medicine

## 2020-06-03 DIAGNOSIS — C4491 Basal cell carcinoma of skin, unspecified: Secondary | ICD-10-CM | POA: Insufficient documentation

## 2020-06-14 ENCOUNTER — Encounter: Payer: Self-pay | Admitting: Family Medicine

## 2020-06-14 MED ORDER — HRT BASE CREA: TOPICAL_CREAM | 11 refills | Status: DC

## 2020-09-14 ENCOUNTER — Other Ambulatory Visit: Payer: Self-pay | Admitting: Family Medicine

## 2020-09-14 DIAGNOSIS — F3181 Bipolar II disorder: Secondary | ICD-10-CM

## 2020-09-25 ENCOUNTER — Encounter: Payer: Self-pay | Admitting: Orthopaedic Surgery

## 2020-09-25 ENCOUNTER — Other Ambulatory Visit: Payer: Self-pay

## 2020-09-25 ENCOUNTER — Ambulatory Visit (INDEPENDENT_AMBULATORY_CARE_PROVIDER_SITE_OTHER): Payer: BC Managed Care – PPO | Admitting: Orthopaedic Surgery

## 2020-09-25 ENCOUNTER — Ambulatory Visit: Payer: Self-pay

## 2020-09-25 VITALS — BP 133/79 | HR 82 | Ht 63.0 in | Wt 145.0 lb

## 2020-09-25 DIAGNOSIS — W000XXA Fall on same level due to ice and snow, initial encounter: Secondary | ICD-10-CM

## 2020-09-25 DIAGNOSIS — M25532 Pain in left wrist: Secondary | ICD-10-CM

## 2020-09-25 DIAGNOSIS — S52572A Other intraarticular fracture of lower end of left radius, initial encounter for closed fracture: Secondary | ICD-10-CM | POA: Diagnosis not present

## 2020-09-25 MED ORDER — HYDROCODONE-ACETAMINOPHEN 5-325 MG PO TABS: ORAL_TABLET | ORAL | 0 refills | Status: DC

## 2020-09-25 NOTE — Progress Notes (Signed)
Subjective:    Patient ID: Kristine Ortega, female    DOB: May 29, 1966, 55 y.o.   MRN: [id number]  HPI She fell on the ice this morning while feeding her dogs and hurt her left wrist. She has pain and swelling but no obvious deformity. She has no other injury.  She has no elbow or shoulder or neck pain.  She has no back pain.  She did not hit her head.  She is right hand dominant   Review of Systems  Constitutional: Positive for activity change.  Musculoskeletal: Positive for arthralgias and joint swelling.  All other systems reviewed and are negative.  For Review of Systems, all other systems reviewed and are negative.  The following is a summary of the past history medically, past history surgically, known current medicines, social history and family history.  This information is gathered electronically by the computer from prior information and documentation.  I review this each visit and have found including this information at this point in the chart is beneficial and informative.   Past Medical History:  Diagnosis Date  . Anxiety   . Cancer [facility])    breast cancer  . Complication of anesthesia   . Depression   . Headache(784.0)   . Motion sickness   . PONV (postoperative nausea and vomiting)     Past Surgical History:  Procedure Laterality Date  . ABDOMINAL HYSTERECTOMY  2007   BSO  . BREAST IMPLANT REMOVAL Bilateral 07/15/2017   Procedure: REMOVAL BREAST IMPLANTS;  Surgeon: Wallace Going, DO;  Location: [place];  Service: Plastics;  Laterality: Bilateral;  . BREAST RECONSTRUCTION  2005   implants-bilat  . CARPAL TUNNEL RELEASE Right 05/16/2013   Procedure: CARPAL TUNNEL RELEASE RIGHT ;  Surgeon: Cammie Sickle., MD;  Location: [place];  Service: Orthopedics;  Laterality: Right;  . CARPAL TUNNEL RELEASE Left 08/08/2013   Procedure: LEFT CARPAL TUNNEL RELEASE;  Surgeon: Cammie Sickle., MD;  Location: [place];  Service: Orthopedics;  Laterality: Left;  . CESAREAN SECTION     x4  . MASTECTOMY W/ SENTINEL NODE BIOPSY  2005   left-  . PLACEMENT OF BREAST IMPLANTS Bilateral 07/15/2017   Procedure: PLACEMENT OF BREAST IMPLANTS;  Surgeon: Wallace Going, DO;  Location: [place];  Service: Plastics;  Laterality: Bilateral;  . PORT-A-CATH REMOVAL  2007   insertion then out  . SIMPLE MASTECTOMY  2005   right-prophalactic  . TONSILLECTOMY      Current Outpatient Medications on File Prior to Visit  Medication Sig Dispense Refill  . Ascorbic Acid (VITAMIN C PO) Take by mouth.    Marland Kitchen BIOTIN PO Take by mouth.    . conjugated estrogens (PREMARIN) vaginal cream USE AS DIRECTED 90 g 2  . Hormone Cream Base (HRT BASE) CREA TESTOSTERONE HRT (10ML) 5% CREAM- APPLY A PEA-SIZE AMOUNT TO LABIA 5X WEEKLY 1000 g 11  . lamoTRIgine (LAMICTAL) 100 MG tablet TAKE 1 AND 1/2 TABLETS     (150MG ) TWICE DAILY (DOSE  CHANGE- PLEASE DISCONTINUE LAMICTAL 25 MG) 270 tablet 1  . LORazepam (ATIVAN) 0.5 MG tablet TAKE (1) TABLET BY MOUTH TWICE DAILY. 30 tablet 1  . MAGNESIUM-OXIDE PO Take by mouth.    . MELATONIN PO Take by mouth.    . Multiple Vitamins-Minerals (WOMENS DAILY FORMULA) TABS Take by mouth.    Marland Kitchen OVER THE COUNTER MEDICATION chamomile, valerian root, hops blend    .  predniSONE (DELTASONE) 10 MG tablet Take 40mg  on days 1-2. Take 30mg  on days 3-4. Take 20mg  on days 5-6. Take 10mg  on days 7-8. Take 5mg  on days 9-10, then stop. (Patient not taking: Reported on 09/25/2020) 21 tablet 0  . Rhodiola rosea (RHODIOLA PO) Take by mouth. Stress Complex    . SUMAtriptan (IMITREX) 100 MG tablet TAKE 1 TABLET DAILY AS     NEEDED. MAY REPEAT IN 2    HOURS IF NO BETTER 90 tablet 3  . triamcinolone cream (KENALOG) 0.1 % Apply 1 application topically 2 (two) times daily. Do not use on face. (Patient not taking: Reported on 09/25/2020) 30 g 0  . VITAMIN D PO Take by mouth.     No current facility-administered  medications on file prior to visit.    Social History   Socioeconomic History  . Marital status: Married    Spouse name: Not on file  . Number of children: Not on file  . Years of education: Not on file  . Highest education level: Not on file  Occupational History  . Not on file  Tobacco Use  . Smoking status: Never Smoker  . Smokeless tobacco: Never Used  Substance and Sexual Activity  . Alcohol use: No  . Drug use: No  . Sexual activity: Yes  Other Topics Concern  . Not on file  Social History Narrative  . Not on file   Social Determinants of Health   Financial Resource Strain: Not on file  Food Insecurity: Not on file  Transportation Needs: Not on file  Physical Activity: Not on file  Stress: Not on file  Social Connections: Not on file  Intimate Partner Violence: Not on file    Family History  Problem Relation Age of Onset  . Diabetes Mother   . Hypertension Mother   . Cancer Father        prostate  . Stroke Father     There were no vitals taken for this visit.  There is no height or weight on file to calculate BMI.      Objective:   Physical Exam Vitals and nursing note reviewed. Exam conducted with a chaperone present.  Constitutional:      Appearance: She is well-developed and well-nourished.  HENT:     Head: Normocephalic and atraumatic.  Eyes:     Extraocular Movements: EOM normal.     Conjunctiva/sclera: Conjunctivae normal.     Pupils: Pupils are equal, round, and reactive to light.  Cardiovascular:     Rate and Rhythm: Normal rate and regular rhythm.     Pulses: Intact distal pulses.  Pulmonary:     Effort: Pulmonary effort is normal.  Abdominal:     Palpations: Abdomen is soft.  Musculoskeletal:       Arms:     Cervical back: Normal range of motion and neck supple.  Skin:    General: Skin is warm and dry.  Neurological:     Mental Status: She is alert and oriented to person, place, and time.     Cranial Nerves: No cranial nerve  deficit.     Motor: No abnormal muscle tone.     Coordination: Coordination normal.     Deep Tendon Reflexes: Reflexes are normal and symmetric. Reflexes normal.  Psychiatric:        Mood and Affect: Mood and affect normal.        Behavior: Behavior normal.        Thought Content: Thought content  normal.        Judgment: Judgment normal.    X-rays were done of the left distal radius, reported separately.  She has acute intra-articular fracture of the radius.       Assessment & Plan:   Encounter Diagnoses  Name Primary?  . Pain in left wrist Yes  . Closed die-punch intra-articular fracture of distal radius, left, initial encounter    She was informed of the findings.  She was placed in sugar tong cast.  She was given sling.  I will have her see hand surgeon.  I have shown her the X-rays.  She used to be a Geologist, engineering.  Because it is intra-articular and slightly displaced I would rather have hand surgeon see her and evaluate.  Rx for pain medicine called in.  Elevate, ice.  Call if any problem.  Precautions discussed.   Electronically Signed Sanjuana Kava, MD 1/19/202210:11 AM

## 2020-10-28 ENCOUNTER — Encounter: Payer: Self-pay | Admitting: Family Medicine

## 2020-10-28 NOTE — Telephone Encounter (Signed)
Okay to establish with Janett Billow, pt has been stable on medications

## 2020-12-18 ENCOUNTER — Encounter: Payer: Self-pay | Admitting: Nurse Practitioner

## 2020-12-18 ENCOUNTER — Other Ambulatory Visit: Payer: Self-pay

## 2020-12-18 ENCOUNTER — Ambulatory Visit: Payer: BC Managed Care – PPO | Admitting: Nurse Practitioner

## 2020-12-18 VITALS — BP 120/82 | HR 89 | Temp 98.6°F | Ht 63.0 in | Wt 147.0 lb

## 2020-12-18 DIAGNOSIS — R109 Unspecified abdominal pain: Secondary | ICD-10-CM | POA: Diagnosis not present

## 2020-12-18 DIAGNOSIS — Z823 Family history of stroke: Secondary | ICD-10-CM | POA: Diagnosis not present

## 2020-12-18 DIAGNOSIS — F3181 Bipolar II disorder: Secondary | ICD-10-CM

## 2020-12-18 DIAGNOSIS — E7801 Familial hypercholesterolemia: Secondary | ICD-10-CM | POA: Diagnosis not present

## 2020-12-18 DIAGNOSIS — F411 Generalized anxiety disorder: Secondary | ICD-10-CM

## 2020-12-18 MED ORDER — ALPRAZOLAM 0.5 MG PO TABS: 0.5000 mg | ORAL_TABLET | Freq: Three times a day (TID) | ORAL | 0 refills | Status: DC

## 2020-12-18 MED ORDER — CYCLOBENZAPRINE HCL 5 MG PO TABS: ORAL_TABLET | ORAL | 0 refills | Status: DC

## 2020-12-18 MED ORDER — CITALOPRAM HYDROBROMIDE 10 MG PO TABS: 10.0000 mg | ORAL_TABLET | Freq: Every day | ORAL | 0 refills | Status: DC

## 2020-12-18 MED ORDER — DICYCLOMINE HCL 10 MG PO CAPS: 10.0000 mg | ORAL_CAPSULE | Freq: Three times a day (TID) | ORAL | 0 refills | Status: DC

## 2020-12-18 MED ORDER — LAMOTRIGINE 150 MG PO TABS: 150.0000 mg | ORAL_TABLET | Freq: Two times a day (BID) | ORAL | 0 refills | Status: DC

## 2020-12-18 NOTE — Progress Notes (Signed)
Subjective:    Patient ID: Kristine Ortega, female    DOB: 08/14/1966, 55 y.o.   MRN: [id number]  HPI: Kristine Ortega is a 55 y.o. female presenting for anxiety follow-up and abdominal cramping.  Chief Complaint  Patient presents with  . GI Problem    Began Sunday having cramping in the stomach. Been happening everyday. Begins after she eats, taking flexeril for the pain.  . Anxiety   ANXIETY/STRESS Using old alprazolam 0.5 mg and cutting in half or taking 1 whole one.  She reports taking this medication 1-2 times daily as needed.  Reports she still has old prescription from years ago that was filled with 90 pills.  She is not taking lorazepam, does not work as well.   Reports she has had bad experiences with anxiety.  Lost grandmother a few months ago.  Is building a house no feels it is a trigger.   Duration: Chronic Anxious mood: yes  Excessive worrying: yes Irritability: yes  Sweating: no Nausea: no Palpitations:no Hyperventilation: no Panic attacks: no Agoraphobia: no  Obscessions/compulsions: no Depressed mood: no Depression screen [facility] 2/9 12/18/2020 11/28/2019 10/25/2019 10/04/2019 12/03/2017  Decreased Interest 0 0 0 0 0  Down, Depressed, Hopeless 0 0 0 0 0  PHQ - 2 Score 0 0 0 0 0  Altered sleeping 1 0 0 3 -  Tired, decreased energy 0 0 0 3 -  Change in appetite 0 0 0 0 -  Feeling bad or failure about yourself  1 0 0 0 -  Trouble concentrating _0 -  Moving slowly or fidgety/restless _1 0 -  Suicidal thoughts 0 0 0 0 -  PHQ-9 Score _2 -  Difficult doing work/chores Very difficult Not difficult at all Somewhat difficult Somewhat difficult -    GAD 7 : Generalized Anxiety Score 12/18/2020 11/28/2019 10/25/2019 10/04/2019  Nervous, Anxious, on Edge _3 Control/stop worrying _4 Worry too much - different things 3 0 1 2  Trouble relaxing _5 Restless _6 Easily annoyed or irritable 3 0 1 0  Afraid - awful might happen 2 0 0 1  Total  GAD 7 Score _7 Anxiety Difficulty Extremely difficult Somewhat difficult Very difficult Very difficult    Anhedonia: no Weight changes: no Insomnia: yes   Hypersomnia: no Fatigue/loss of energy: no Feelings of worthlessness: yes Feelings of guilt: yes Impaired concentration/indecisiveness: yes Suicidal ideations: no  Crying spells: no  ABDOMINAL PAIN  Patient reports mild to moderate abdominal cramping that occurs in the afternoon only.  She eats breakfast 5 to 6 hours before this and has not changed what she has been eating for breakfast.  She normally eats either one half PB sandwich, cheese stick/banana, eggs and bacon. Duration: days Onset: sudden Severity: severe - has to stop working/what she is doing Quality: cramping Location:  Diffuse; maybe epigastric  Episode duration: hours Radiation: no Frequency: every afternoon since Sunday  Alleviating factors: Flexeril Aggravating factors: eating breakfast Status: stable Treatments attempted: Flexeril Fever: no Nausea: yes  With cramping Vomiting: no Weight loss: no Bloating: No Decreased appetite: yes Diarrhea: no Constipation: no Blood in stool: no  Daily bowel movement: Yes or every other day, brown, no changes in frequency, color, or caliber Heartburn: no Jaundice: no Rash: no Dysuria/urinary frequency: no Hematuria: no  New medications: took [place] last  week 2 times only to see if this would help with stress.  She stopped this Sunday and the pain has not resolved  History of sexually transmitted disease: no Recurrent NSAID use:  no  No Known Allergies  Outpatient Encounter Medications as of 12/18/2020  Medication Sig  . Ascorbic Acid (VITAMIN C PO) Take by mouth.  Marland Kitchen BIOTIN PO Take by mouth.  . citalopram (CELEXA) 10 MG tablet Take 1 tablet (10 mg total) by mouth daily.  Marland Kitchen conjugated estrogens (PREMARIN) vaginal cream USE AS DIRECTED  . dicyclomine (BENTYL) 10 MG capsule Take 1 capsule (10 mg  total) by mouth 4 (four) times daily -  before meals and at bedtime.  Marland Kitchen Hormone Cream Base (HRT BASE) CREA TESTOSTERONE HRT (10ML) 5% CREAM- APPLY A PEA-SIZE AMOUNT TO LABIA 5X WEEKLY  . lamoTRIgine (LAMICTAL) 150 MG tablet Take 1 tablet (150 mg total) by mouth 2 (two) times daily.  Marland Kitchen MAGNESIUM-OXIDE PO Take by mouth.  . MELATONIN PO Take by mouth.  . Multiple Vitamins-Minerals (WOMENS DAILY FORMULA) TABS Take by mouth.  Marland Kitchen OVER THE COUNTER MEDICATION chamomile, valerian root, hops blend  . Rhodiola rosea (RHODIOLA PO) Take by mouth. Stress Complex  . SUMAtriptan (IMITREX) 100 MG tablet TAKE 1 TABLET DAILY AS     NEEDED. MAY REPEAT IN 2    HOURS IF NO BETTER  . triamcinolone cream (KENALOG) 0.1 % Apply 1 application topically 2 (two) times daily. Do not use on face.  Marland Kitchen VITAMIN D PO Take by mouth.  . [DISCONTINUED] HYDROcodone-acetaminophen (NORCO/VICODIN) 5-325 MG tablet One tablet every four hours for pain.  . [DISCONTINUED] lamoTRIgine (LAMICTAL) 100 MG tablet TAKE 1 AND 1/2 TABLETS     (150MG) TWICE DAILY (DOSE  CHANGE- PLEASE DISCONTINUE LAMICTAL 25 MG)  . [DISCONTINUED] LORazepam (ATIVAN) 0.5 MG tablet TAKE (1) TABLET BY MOUTH TWICE DAILY.  . [DISCONTINUED] predniSONE (DELTASONE) 10 MG tablet Take 16m on days 1-2. Take 345mon days 3-4. Take 202mn days 5-6. Take 43m23m days 7-8. Take 5mg 43mdays 9-10, then stop.  . ALPMarland KitchenAZolam (XANAX) 0.5 MG tablet Take 1 tablet (0.5 mg total) by mouth every 8 (eight) hours.  . cyclobenzaprine (FLEXERIL) 5 MG tablet TAKE 1 OR 2 TABLETS BYSMOUTH 3 TIMES DAILY AS NEEDED.  . [DISCONTINUED] cyclobenzaprine (FLEXERIL) 5 MG tablet TAKE 1 OR 2 TABLETS BYSMOUTH 3 TIMES DAILY AS NEEDED.   No facility-administered encounter medications on file as of 12/18/2020.    Patient Active Problem List   Diagnosis Date Noted  . Family history of stroke 12/18/2020  . Abdominal cramping 12/18/2020  . Hyperlipidemia 10/25/2019  . Bipolar II disorder (HCC) [place]10/2016  . MDD  (major depressive disorder), single episode 11/03/2013  . GAD (generalized anxiety disorder) 11/03/2013  . Migraines 07/25/2013  . Post menopausal syndrome 07/25/2013  . Breast cancer, left (HCC) [place]12/2013  . BRCA2 positive 02/17/2012  . S/P right mastectomy 02/17/2012  . S/P hysterectomy with oophorectomy 02/17/2012  . Osteopenia 02/17/2012    Past Medical History:  Diagnosis Date  . Anxiety   . [facility]  . Complication of anesthesia   . Depression   . Headache(784.0)   . Motion sickness   . PONV (postoperative nausea and vomiting)     Relevant past medical, surgical, family and social history reviewed and updated as indicated. Interim medical history since our last visit reviewed.  Review of Systems Per HPI unless specifically indicated above  Objective:    BP 120/82   Pulse 89   Temp 98.6 F (37 C)   Ht _0  (1.6 m)   Wt 147 lb (66.7 kg)   SpO2 98%   BMI 26.04 kg/m   Wt Readings from Last 3 Encounters:  12/18/20 147 lb (66.7 kg)  09/25/20 145 lb (65.8 kg)  11/28/19 142 lb (64.4 kg)    Physical Exam Vitals and nursing note reviewed.  Constitutional:      General: She is not in acute distress.    Appearance: Normal appearance. She is not toxic-appearing.  HENT:     Head: Normocephalic and atraumatic.  Eyes:     General: No scleral icterus.    Extraocular Movements: Extraocular movements intact.  Neck:     Vascular: No carotid bruit.  Cardiovascular:     Rate and Rhythm: Normal rate and regular rhythm.     Heart sounds: Normal heart sounds. No murmur heard.   Pulmonary:     Effort: Pulmonary effort is normal. No respiratory distress.     Breath sounds: Normal breath sounds. No wheezing, rhonchi or rales.  Abdominal:     General: Abdomen is flat. Bowel sounds are normal. There is no distension.     Palpations: Abdomen is soft. There is no mass.     Tenderness: There is no abdominal tenderness. There is no right CVA  tenderness, left CVA tenderness, guarding or rebound.     Hernia: No hernia is present.  Musculoskeletal:     Cervical back: Normal range of motion.  Skin:    General: Skin is warm and dry.     Capillary Refill: Capillary refill takes less than 2 seconds.     Coloration: Skin is not jaundiced or pale.     Findings: No erythema.  Neurological:     Mental Status: She is alert and oriented to person, place, and time.     Motor: No weakness.     Gait: Gait normal.  Psychiatric:        Mood and Affect: Mood normal.        Behavior: Behavior normal.        Thought Content: Thought content normal.        Judgment: Judgment normal.       Assessment & Plan:   Problem List Items Addressed This Visit      Other   Hyperlipidemia    Chronic.  She has not wanted to take a statin in the past.  We discussed at length her increased cardiovascular risk given her father's history of stroke and her significantly elevated LDL and total cholesterol levels.  Discussed benefit from statins versus Zetia and Repatha.  Also discussed red yeast rice and fish oil, not as beneficial as statins.  Patient will consider these medications.  Lipids levels checked today-patient is fasting.  Follow-up in 6 months.      Relevant Orders   Lipid Panel   GAD (generalized anxiety disorder)    Chronic.  PHQ-9 and GAD-7 elevated today.  No SI/HI today.  Reports she has tried Effexor, Zoloft, Brintellix, Lexapro, trazodone, and Remeron.  PDMP reviewed and appropriate-Will refill alprazolam 0.5 mg to use very sparingly.  Discussed at length that she should not take this medication daily.  We will also start on citalopram 10 mg and see how she does with this after 2 to 4 weeks.  Continue Lamictal 150 mg twice daily for now.  Consider counseling/psychiatry in future if mood remains  uncontrolled.      Relevant Medications   ALPRAZolam (XANAX) 0.5 MG tablet   citalopram (CELEXA) 10 MG tablet   Family history of stroke    Relevant Orders   Lipid Panel   Bipolar II disorder ([place]) - Primary    Stable.  Continue Lamictal 150 mg twice daily.  Also okay to continue alprazolam 0.5 mg 1-2 times daily-PDMP reviewed and appropriate and refill given today.  Planning to start citalopram today day to help with uncontrolled anxiety/some depressive symptoms.  If this does not help, consider referral to counseling/psychiatry.      Relevant Medications   lamoTRIgine (LAMICTAL) 150 MG tablet   Other Relevant Orders   CBC with Differential   Abdominal cramping    Acute.  Question if related to supplement that she started last week versus stress.  No red flags and examination today normal.  Will check H. pylori antigen in stool as well as CBC and CMP for blood counts and liver function.  We will also give refill of muscle relaxant as this seemed to help as well as start Bentyl to monitor for improvement.  Follow-up if not improving.      Relevant Medications   cyclobenzaprine (FLEXERIL) 5 MG tablet   dicyclomine (BENTYL) 10 MG capsule   Other Relevant Orders   CBC with Differential   COMPLETE METABOLIC PANEL WITH GFR       Follow up plan: Return in about 4 weeks (around 01/15/2021) for 2-4 weeks.

## 2020-12-18 NOTE — Assessment & Plan Note (Signed)
Stable.  Continue Lamictal 150 mg twice daily.  Also okay to continue alprazolam 0.5 mg 1-2 times daily-PDMP reviewed and appropriate and refill given today.  Planning to start citalopram today day to help with uncontrolled anxiety/some depressive symptoms.  If this does not help, consider referral to counseling/psychiatry.

## 2020-12-18 NOTE — Assessment & Plan Note (Signed)
Chronic.  She has not wanted to take a statin in the past.  We discussed at length her increased cardiovascular risk given her father's history of stroke and her significantly elevated LDL and total cholesterol levels.  Discussed benefit from statins versus Zetia and Repatha.  Also discussed red yeast rice and fish oil, not as beneficial as statins.  Patient will consider these medications.  Lipids levels checked today-patient is fasting.  Follow-up in 6 months.

## 2020-12-18 NOTE — Patient Instructions (Signed)
Evolocumab injection What is this medicine? EVOLOCUMAB (e voe LOK ue mab) treats high cholesterol. It is used with lifestyle changes, like diet and exercise. It is used alone or with other medicines. This medicine may be used for other purposes; ask your health care provider or pharmacist if you have questions. COMMON BRAND NAME(S): Repatha What should I tell my health care provider before I take this medicine? They need to know if you have any of these conditions:  an unusual or allergic reaction to evolocumab, other medicines, latex, foods, dyes, or preservatives  pregnant or trying to get pregnant  breast-feeding How should I use this medicine? This medicine is injected under the skin. You will be taught how to prepare and give it. Take it as directed on the prescription label at the same time every day. Keep taking it unless your health care provider tells you to stop. It is important that you put your used needles and syringes in a special sharps container. Do not put them in a trash can. If you do not have a sharps container, call your pharmacist or health care provider to get one. This medicine comes with INSTRUCTIONS FOR USE. Ask your pharmacist for directions on how to use this medicine. Read the information carefully. Talk to your pharmacist or health care provider if you have questions. Talk to your health care provider about the use of this medicine in children. While it may be prescribed for children as young as 10 for selected conditions, precautions do apply. Overdosage: If you think you have taken too much of this medicine contact a poison control center or emergency room at once. NOTE: This medicine is only for you. Do not share this medicine with others. What if I miss a dose? It is important not to miss any doses. Talk to your health care provider about what to do if you miss a dose. What may interact with this medicine? Interactions are not expected. This list may not  describe all possible interactions. Give your health care provider a list of all the medicines, herbs, non-prescription drugs, or dietary supplements you use. Also tell them if you smoke, drink alcohol, or use illegal drugs. Some items may interact with your medicine. What should I watch for while using this medicine? Visit your health care provider for regular checks on your progress. Tell your health care provider if your symptoms do not start to get better or if they get worse. You may need blood work while you are taking this drug. Do not wear the on-body infuser during an MRI. Taking this drug is only part of a total heart healthy program. Your health care provider may give you a special diet to follow. Avoid alcohol. Avoid smoking. Ask your health care provider how much you should exercise. What side effects may I notice from receiving this medicine? Side effects that you should report to your doctor or health care provider as soon as possible:  allergic reactions (skin rash, itching or hives; swelling of the face, lips, or tongue)  high blood sugar (increased hunger, thirst, or urination; unusually weak or tired, blurry vision)  infection (fever, chills, cough, sore throat, pain, or trouble passing urine) Side effects that usually do not require medical attention (report to your doctor or health care provider if they continue or are bothersome):  diarrhea  muscle pain  nausea  pain, redness, or irritation at site where injected This list may not describe all possible side effects. Call your doctor for  medical advice about side effects. You may report side effects to FDA at 1-800-FDA-1088. Where should I keep my medicine? Keep out of the reach of children and pets. Store in a refrigerator or at room temperature between 20 and 25 degrees C (68 and 77 degrees F). Refrigeration (preferred): Store it in the refrigerator. Do not freeze. Keep it in the original carton until you are ready  to take it. Remove the dose from the carton about 30 minutes before it is time for you to take it. Throw away any unused medicine after the expiration date. Room temperature: This medicine may be stored at room temperature for up to 30 days. Keep it in the original carton until you are ready to take it. If it is stored at room temperature, throw away any unused medicine after 30 days or after it expires, whichever is first. Protect from light. Do not shake. Avoid exposure to extreme heat. To get rid of medicines that are no longer needed or have expired:  Take the medicine to a medicine take-back program. Check with your pharmacy or law enforcement to find a location.  If you cannot return the medicine, ask your pharmacist or health care provider how to get rid of this medicine safely. NOTE: This sheet is a summary. It may not cover all possible information. If you have questions about this medicine, talk to your doctor, pharmacist, or health care provider.  2021 Elsevier/Gold Standard (2019-08-16 12:26:14)   Ezetimibe Tablets What is this medicine? EZETIMIBE (ez ET i mibe) treats high cholesterol. Ezetimibe blocks the absorption of cholesterol from the stomach. It is used with lifestyle changes, like diet and exercise. It may be used alone or with other medicines. This medicine may be used for other purposes; ask your health care provider or pharmacist if you have questions. COMMON BRAND NAME(S): Zetia What should I tell my health care provider before I take this medicine? They need to know if you have any of these conditions:  kidney disease  liver disease  muscle cramps, pain  muscle injury  thyroid disease  an unusual or allergic reaction to ezetimibe, other medicines, foods, dyes, or preservatives  pregnant or trying to get pregnant  breast-feeding How should I use this medicine? Take this medicine by mouth. Take it as directed on the prescription label at the same time  every day. You can take it with or without food. If it upsets your stomach, take it with food. Keep taking it unless your health care provider tells you to stop. Take bile acid sequestrants at a different time of day than this medicine. Take this medicine 2 hours BEFORE or 4 hours AFTER bile acid sequestrants. Talk to your health care provider about the use of this medicine in children. While it may be prescribed for children as young as 10 for selected conditions, precautions do apply. Overdosage: If you think you have taken too much of this medicine contact a poison control center or emergency room at once. NOTE: This medicine is only for you. Do not share this medicine with others. What if I miss a dose? If you miss a dose, take it as soon as you can. If it is almost time for your next dose, take only that dose. Do not take double or extra doses. What may interact with this medicine? Do not take this medicine with any of the following medications:  fenofibrate  gemfibrozil This medicine may also interact with the following medications:  antacids  cyclosporine  herbal medicines like red yeast rice  other medicines to lower cholesterol or triglycerides This list may not describe all possible interactions. Give your health care provider a list of all the medicines, herbs, non-prescription drugs, or dietary supplements you use. Also tell them if you smoke, drink alcohol, or use illegal drugs. Some items may interact with your medicine. What should I watch for while using this medicine? Visit your health care provider for regular checks on your progress. Tell your health care provider if your symptoms do not start to get better or if they get worse. Your health care provider may tell you to stop taking this medicine if you develop muscle problems. If your muscle problems do not go away after stopping this medicine, contact your health care provider. Do not become pregnant while taking this  medicine. Women should inform their health care provider if they wish to become pregnant or think they might be pregnant. There is potential for serious harm to an unborn child. Talk to your health care provider for more information. Do not breast-feed an infant while taking this medicine. Taking this medicine is only part of a total heart healthy program. Your health care provider may give you a special diet to follow. Avoid alcohol. Avoid smoking. Ask your health care provider how much you should exercise. What side effects may I notice from receiving this medicine? Side effects that you should report to your doctor or health care provider as soon as possible:  allergic reactions (skin rash, itching or hives; swelling of the face, lips, or tongue)  liver injury (dark yellow or brown urine; general ill feeling or flu-like symptoms; loss of appetite, right upper belly pain; unusually weak or tired, yellowing of the eyes or skin)  muscle injury (dark urine; trouble passing urine or change in the amount of urine; unusually weak or tired; muscle pain; back pain) Side effects that usually do not require medical attention (report to your doctor or health care provider if they continue or are bothersome):  depressed mood  diarrhea  headache  infection (fever, chills, cough, sore throat, pain, or trouble passing urine)  joint pain  stomach pain This list may not describe all possible side effects. Call your doctor for medical advice about side effects. You may report side effects to FDA at 1-800-FDA-1088. Where should I keep my medicine? Keep out of the reach of children and pets. Store at room temperature between 15 and 30 degrees C (59 and 86 degrees F). Protect from moisture. Get rid of any unused medicine after the expiration date. NOTE: This sheet is a summary. It may not cover all possible information. If you have questions about this medicine, talk to your doctor, pharmacist, or health  care provider.  2021 Elsevier/Gold Standard (2019-08-02 17:28:51)

## 2020-12-18 NOTE — Assessment & Plan Note (Addendum)
Chronic.  PHQ-9 and GAD-7 elevated today.  No SI/HI today.  Reports she has tried Effexor, Zoloft, Brintellix, Lexapro, trazodone, and Remeron.  PDMP reviewed and appropriate-Will refill alprazolam 0.5 mg to use very sparingly.  Discussed at length that she should not take this medication daily.  We will also start on citalopram 10 mg and see how she does with this after 2 to 4 weeks.  Continue Lamictal 150 mg twice daily for now.  Consider counseling/psychiatry in future if mood remains uncontrolled.

## 2020-12-18 NOTE — Assessment & Plan Note (Signed)
Acute.  Question if related to supplement that she started last week versus stress.  No red flags and examination today normal.  Will check H. pylori antigen in stool as well as CBC and CMP for blood counts and liver function.  We will also give refill of muscle relaxant as this seemed to help as well as start Bentyl to monitor for improvement.  Follow-up if not improving.

## 2020-12-19 LAB — CBC WITH DIFFERENTIAL/PLATELET
Absolute Monocytes: 439 cells/uL (ref 200–950)
Basophils Absolute: 40 cells/uL (ref 0–200)
Basophils Relative: 0.7 %
Eosinophils Absolute: 211 cells/uL (ref 15–500)
Eosinophils Relative: 3.7 %
HCT: 41.4 % (ref 35.0–45.0)
Hemoglobin: 13.6 g/dL (ref 11.7–15.5)
Lymphs Abs: 1613 cells/uL (ref 850–3900)
MCH: 28.3 pg (ref 27.0–33.0)
MCHC: 32.9 g/dL (ref 32.0–36.0)
MCV: 86.3 fL (ref 80.0–100.0)
MPV: 10.8 fL (ref 7.5–12.5)
Monocytes Relative: 7.7 %
Neutro Abs: 3397 cells/uL (ref 1500–7800)
Neutrophils Relative %: 59.6 %
Platelets: 249 10*3/uL (ref 140–400)
RBC: 4.8 10*6/uL (ref 3.80–5.10)
RDW: 12.4 % (ref 11.0–15.0)
Total Lymphocyte: 28.3 %
WBC: 5.7 10*3/uL (ref 3.8–10.8)

## 2020-12-19 LAB — LIPID PANEL
Cholesterol: 242 mg/dL — ABNORMAL HIGH (ref <200)
HDL: 49 mg/dL — ABNORMAL LOW (ref >=50)
LDL Cholesterol (Calc): 167 mg/dL (calc) — ABNORMAL HIGH
Non-HDL Cholesterol (Calc): 193 mg/dL (calc) — ABNORMAL HIGH (ref <130)
Total CHOL/HDL Ratio: 4.9 (calc) (ref <5.0)
Triglycerides: 124 mg/dL (ref <150)

## 2020-12-19 LAB — COMPLETE METABOLIC PANEL WITH GFR
AG Ratio: 1.9 (calc) (ref 1.0–2.5)
ALT: 14 U/L (ref 6–29)
AST: 17 U/L (ref 10–35)
Albumin: 4.3 g/dL (ref 3.6–5.1)
Alkaline phosphatase (APISO): 57 U/L (ref 37–153)
BUN: 12 mg/dL (ref 7–25)
CO2: 27 mmol/L (ref 20–32)
Calcium: 9.4 mg/dL (ref 8.6–10.4)
Chloride: 106 mmol/L (ref 98–110)
Creat: 0.81 mg/dL (ref 0.50–1.05)
GFR, Est African American: 94 mL/min/{1.73_m2} (ref >=60)
GFR, Est Non African American: 81 mL/min/{1.73_m2} (ref >=60)
Globulin: 2.3 g/dL (calc) (ref 1.9–3.7)
Glucose, Bld: 93 mg/dL (ref 65–99)
Potassium: 4.7 mmol/L (ref 3.5–5.3)
Sodium: 142 mmol/L (ref 135–146)
Total Bilirubin: 0.4 mg/dL (ref 0.2–1.2)
Total Protein: 6.6 g/dL (ref 6.1–8.1)

## 2021-01-15 ENCOUNTER — Ambulatory Visit: Payer: BC Managed Care – PPO | Admitting: Nurse Practitioner

## 2021-02-07 ENCOUNTER — Other Ambulatory Visit: Payer: Self-pay | Admitting: Family Medicine

## 2021-02-07 DIAGNOSIS — F3181 Bipolar II disorder: Secondary | ICD-10-CM

## 2021-02-10 MED ORDER — LAMOTRIGINE 150 MG PO TABS: 150.0000 mg | ORAL_TABLET | Freq: Two times a day (BID) | ORAL | 1 refills | Status: DC

## 2021-02-20 ENCOUNTER — Encounter: Payer: Self-pay | Admitting: Nurse Practitioner

## 2021-02-24 ENCOUNTER — Encounter: Payer: Self-pay | Admitting: Nurse Practitioner

## 2021-02-24 DIAGNOSIS — F3181 Bipolar II disorder: Secondary | ICD-10-CM

## 2021-02-24 MED ORDER — HRT BASE CREA: TOPICAL_CREAM | 11 refills | Status: DC

## 2021-02-24 MED ORDER — LAMOTRIGINE 150 MG PO TABS: 150.0000 mg | ORAL_TABLET | Freq: Two times a day (BID) | ORAL | 1 refills | Status: DC

## 2021-03-26 ENCOUNTER — Encounter: Payer: Self-pay | Admitting: *Deleted

## 2021-03-26 NOTE — Telephone Encounter (Signed)
This encounter was created in error - please disregard.

## 2021-04-01 ENCOUNTER — Encounter: Payer: Self-pay | Admitting: Nurse Practitioner

## 2021-04-01 DIAGNOSIS — Z780 Asymptomatic menopausal state: Secondary | ICD-10-CM

## 2021-04-02 MED ORDER — ESTRADIOL 10 MCG VA TABS: 10.0000 ug | ORAL_TABLET | VAGINAL | 0 refills | Status: DC

## 2021-04-11 ENCOUNTER — Encounter: Payer: Self-pay | Admitting: Nurse Practitioner

## 2021-04-11 DIAGNOSIS — F411 Generalized anxiety disorder: Secondary | ICD-10-CM

## 2021-04-23 ENCOUNTER — Encounter: Payer: Self-pay | Admitting: Nurse Practitioner

## 2021-04-23 ENCOUNTER — Other Ambulatory Visit: Payer: Self-pay | Admitting: *Deleted

## 2021-04-23 MED ORDER — SUMATRIPTAN SUCCINATE 100 MG PO TABS: ORAL_TABLET | ORAL | 3 refills | Status: DC

## 2021-04-30 ENCOUNTER — Ambulatory Visit (HOSPITAL_COMMUNITY): Payer: BC Managed Care – PPO | Admitting: Psychiatry

## 2021-04-30 ENCOUNTER — Other Ambulatory Visit: Payer: Self-pay

## 2021-05-08 ENCOUNTER — Ambulatory Visit (HOSPITAL_COMMUNITY): Payer: BC Managed Care – PPO | Admitting: Clinical

## 2021-05-14 ENCOUNTER — Other Ambulatory Visit: Payer: Self-pay

## 2021-05-14 ENCOUNTER — Ambulatory Visit (INDEPENDENT_AMBULATORY_CARE_PROVIDER_SITE_OTHER): Payer: BC Managed Care – PPO | Admitting: Clinical

## 2021-05-14 DIAGNOSIS — F411 Generalized anxiety disorder: Secondary | ICD-10-CM

## 2021-05-14 NOTE — Progress Notes (Signed)
Virtual Visit via Video Note  I connected with Kristine Ortega on 05/14/21 at  9:00 AM EDT by a video enabled telemedicine application and verified that I am speaking with the correct person using two identifiers.  Location: Patient: Home Provider: Office   I discussed the limitations of evaluation and management by telemedicine and the availability of in person appointments. The patient expressed understanding and agreed to proceed.    Comprehensive Clinical Assessment (CCA) Note  05/14/2021 Kristine Ortega [id number]  Chief Complaint:GAD  Visit Diagnosis: GAD   CCA Screening, Triage and Referral (STR)  Patient Reported Information How did you hear about [place]? No data recorded Referral name: No data recorded Referral phone number: No data recorded  Whom do you see for routine medical problems? No data recorded Practice/Facility Name: No data recorded Practice/Facility Phone Number: No data recorded Name of Contact: No data recorded Contact Number: No data recorded Contact Fax Number: No data recorded Prescriber Name: No data recorded Prescriber Address (if known): No data recorded  What Is the Reason for Your Visit/Call Today? No data recorded How Long Has This Been Causing You Problems? No data recorded What Do You Feel Would Help You the Most Today? No data recorded  Have You Recently Been in Any Inpatient Treatment (Hospital/Detox/Crisis Center/28-Day Program)? No data recorded Name/Location of Program/Hospital:No data recorded How Long Were You There? No data recorded When Were You Discharged? No data recorded  Have You Ever Received Services From [facility] Before? No data recorded Who Do You See at [facility]? No data recorded  Have You Recently Had Any Thoughts About Hurting Yourself? No data recorded Are You Planning to Commit Suicide/Harm Yourself At This time? No data recorded  Have you Recently Had Thoughts About [place]? No data  recorded Explanation: No data recorded  Have You Used Any Alcohol or Drugs in the Past 24 Hours? No data recorded How Long Ago Did You Use Drugs or Alcohol? No data recorded What Did You Use and How Much? No data recorded  Do You Currently Have a Therapist/Psychiatrist? No data recorded Name of Therapist/Psychiatrist: No data recorded  Have You Been Recently Discharged From Any Office Practice or Programs? No data recorded Explanation of Discharge From Practice/Program: No data recorded    CCA Screening Triage Referral Assessment Type of Contact: No data recorded Is this Initial or Reassessment? No data recorded Date Telepsych consult ordered in [facility]:  No data recorded Time Telepsych consult ordered in [facility]:  No data recorded  Patient Reported Information Reviewed? No data recorded Patient Left Without Being Seen? No data recorded Reason for Not Completing Assessment: No data recorded  Collateral Involvement: No data recorded  Does Patient Have a [place]? No data recorded Name and Contact of Legal Guardian: No data recorded If Minor and Not Living with Parent(s), Who has Custody? No data recorded Is CPS involved or ever been involved? No data recorded Is APS involved or ever been involved? No data recorded  Patient Determined To Be At Risk for Harm To Self or Others Based on Review of Patient Reported Information or Presenting Complaint? No data recorded Method: No data recorded Availability of Means: No data recorded Intent: No data recorded Notification Required: No data recorded Additional Information for Danger to Others Potential: No data recorded Additional Comments for Danger to Others Potential: No data recorded Are There Guns or Other Weapons in Your Home? No data recorded Types of Guns/Weapons: No data recorded  Are These Weapons Safely Secured?                            No data recorded Who Could Verify You Are Able To Have These Secured: No  data recorded Do You Have any Outstanding Charges, Pending Court Dates, Parole/Probation? No data recorded Contacted To Inform of Risk of Harm To Self or Others: No data recorded  Location of Assessment: No data recorded  Does Patient Present under Involuntary Commitment? No data recorded IVC Papers Initial File Date: No data recorded  [place] of Residence: No data recorded  Patient Currently Receiving the Following Services: No data recorded  Determination of Need: No data recorded  Options For Referral: No data recorded    CCA Biopsychosocial Intake/Chief Complaint:  The patient was in 2005 was diagnosed with breast cancer this led to difficulty with anxiousness and GAD. In 2015  both of her sons left and the patient moved to a secluded area in [place] and this transition created anxiety.  Current Symptoms/Problems: with prior expressed episodes the patient noted feeling shaky and  panicky.   Patient Reported Schizophrenia/Schizoaffective Diagnosis in Past: No   Strengths: Good wife, mother, ang grandmother. I have good character  Preferences: spening time with family, cooking, bible study, sewing and quilting.  Abilities: sewing /quilting   Type of Services Patient Feels are Needed: Medication Management / Individual Therapy   Initial Clinical Notes/Concerns: The patient notes no prior mental health services outside of her Anxiety episode in 2015. The patient was hospitalized in 2015 due to a Anxiety episode. The patient notes no current S/I or H/I.   Mental Health Symptoms Depression:   None   Duration of Depressive symptoms: No data recorded  Mania:   None   Anxiety:    Restlessness; Tension; Worrying   Psychosis:   None   Duration of Psychotic symptoms: No data recorded  Trauma:   None   Obsessions:   None   Compulsions:   None   Inattention:   None   Hyperactivity/Impulsivity:   None   Oppositional/Defiant Behaviors:   None   Emotional  Irregularity:   None   Other Mood/Personality Symptoms:   NA    Mental Status Exam Appearance and self-care  Stature:   Average   Weight:   Average weight   Clothing:   Casual   Grooming:   Normal   Cosmetic use:   Age appropriate   Posture/gait:   Normal   Motor activity:   Not Remarkable   Sensorium  Attention:   Normal   Concentration:   Normal   Orientation:   X5   Recall/memory:   Normal   Affect and Mood  Affect:   Appropriate   Mood:  No data recorded  Relating  Eye contact:   Normal   Facial expression:   Responsive   Attitude toward examiner:   Cooperative   Thought and Language  Speech flow:  Normal   Thought content:   Appropriate to Mood and Circumstances   Preoccupation:   None   Hallucinations:   None   Organization:  Landscape architect of Knowledge:  Average   Intelligence:   Average   Abstraction:   Normal   Judgement:   Good   Reality Testing:   Realistic   Insight:   Good   Decision Making:   Normal   Social Functioning  Social Maturity:  Responsible   Social Judgement:   Normal   Stress  Stressors:   Grief/losses; Illness; Transitions (Notes lossing a close friend within the past year. Currently in transition with moving and building a new house. Migrains.)   Coping Ability:   Normal   Skill Deficits:   None   Supports:   Family; Friends/Service system     Religion: Religion/Spirituality Are You A Religious Person?: Yes What is Your Religious Affiliation?: Christian How Might This Affect Treatment?: Protective factor  Leisure/Recreation: Leisure / Recreation Do You Have Hobbies?: Yes Leisure and Hobbies: Quilting and sewing  Exercise/Diet: Exercise/Diet Do You Exercise?: Yes What Type of Exercise Do You Do?: Run/Walk How Many Times a Week Do You Exercise?: 1-3 times a week Have You Gained or Lost A Significant Amount of Weight in the Past Six Months?:  No Do You Follow a Special Diet?: No Do You Have Any Trouble Sleeping?: No   CCA Employment/Education Employment/Work Situation: Employment / Work Situation Employment Situation: Unemployed Where is Patient Currently Employed?: 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 Are You Satisfied With Your Job?: No (NA) Do You Work More Than One Job?: No Work Stressors: The patient noted she decided to come out of the work force previously due to wanting to stay at home and take care of her family. Patient's Job has Been Impacted by Current Illness: No What is the Longest Time Patient has Held a Job?: 56yr Where was the Patient Employed at that Time[place] ever Been in the [employer]: No  Education: Education Is Patient Currently Attending School?: No Last Grade Completed: 12 Name of High School: [employer]acher, adult educationFrom [employer]: Yes Did YPhysicist, medical: Yes What Type of College Degree Do you Have?: [employer] - Certification Did Y[place]: No What Was Your Major?: NA Did You Have Any Special Interests In School?: NA Did You Have An Individualized Education Program (IIEP): No Did You Have Any Difficulty At School?: No Patient's Education Has Been Impacted by Current Illness: No   CCA Family/Childhood History Family and Relationship History: Family history Marital status: Married Number of Years Married: 39 What types of issues is patient dealing with in the relationship?: None Additional relationship information: NA Are you sexually active?: Yes What is your sexual orientation?: Heterosexual Has your sexual activity been affected by drugs, alcohol, medication, or emotional stress?: NA Does patient have children?: Yes How many children?: 4 How is patient's relationship with their children?: The patient notes , " I have a great relationship with my kids  currently".  Childhood History:  Childhood History By whom was/is the patient raised?: Both parents Additional childhood history information: No Additional Description of patient's relationship with caregiver when they were a child: The patient notes, " I had a very good relationship with my parents growing up". Patient's description of current relationship with people who raised him/her: The patient notes, " I have a good relationship with my parents currently". How were you disciplined when you got in trouble as a child/adolescent?: Talking too, privledges taken away Does patient have siblings?: Yes Number of Siblings: 1 Description of patient's current relationship with siblings: The patient notes having 1 younger brother. The patient notes, " I have a good relationship with my younger brother currently". Did patient suffer any verbal/emotional/physical/sexual abuse as a child?: No Did patient suffer from severe childhood neglect?: No Has patient ever  been sexually abused/assaulted/raped as an adolescent or adult?: No Was the patient ever a victim of a crime or a disaster?: No Witnessed domestic violence?: No Has patient been affected by domestic violence as an adult?: No  Child/Adolescent Assessment:     CCA Substance Use Alcohol/Drug Use: Alcohol / Drug Use Pain Medications: see med list Prescriptions: see med list Over the Counter: None History of alcohol / drug use?: No history of alcohol / drug abuse Longest period of sobriety (when/how long): NA (na) Negative Consequences of Use:  (na) Withdrawal Symptoms:  (na)                         ASAM's:  Six Dimensions of Multidimensional Assessment  Dimension 1:  Acute Intoxication and/or Withdrawal Potential:      Dimension 2:  Biomedical Conditions and Complications:      Dimension 3:  Emotional, Behavioral, or Cognitive Conditions and Complications:     Dimension 4:  Readiness to Change:     Dimension 5:   Relapse, Continued use, or Continued Problem Potential:     Dimension 6:  Recovery/Living Environment:     ASAM Severity Score:    ASAM Recommended Level of Treatment:     Substance use Disorder (SUD)    Recommendations for Services/Supports/Treatments: Recommendations for Services/Supports/Treatments Recommendations For Services/Supports/Treatments: Individual Therapy, Medication Management  DSM5 Diagnoses: Patient Active Problem List   Diagnosis Date Noted   Family history of stroke 12/18/2020   Abdominal cramping 12/18/2020   Hyperlipidemia 10/25/2019   Bipolar II disorder ([place]) 09/16/2014   MDD (major depressive disorder), single episode 11/03/2013   GAD (generalized anxiety disorder) 11/03/2013   Migraines 07/25/2013   Post menopausal syndrome 07/25/2013   Breast cancer, left ([place]) 02/17/2012   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 The patient was provided an opportunity to ask questions and all were answered. The patient agreed with the plan and demonstrated an understanding of the instructions.   The patient was advised to call back or seek an in-person evaluation if the symptoms worsen or if the condition fails to improve as anticipated.  I provided 60 minutes of non-face-to-face time during this encounter.  Lennox Grumbles, LCSW  05/14/2021

## 2021-06-10 ENCOUNTER — Other Ambulatory Visit: Payer: Self-pay | Admitting: Nurse Practitioner

## 2021-06-10 DIAGNOSIS — Z780 Asymptomatic menopausal state: Secondary | ICD-10-CM

## 2021-08-13 ENCOUNTER — Encounter: Payer: Self-pay | Admitting: Nurse Practitioner

## 2021-08-14 ENCOUNTER — Encounter: Payer: Self-pay | Admitting: Nurse Practitioner

## 2021-08-14 ENCOUNTER — Other Ambulatory Visit: Payer: Self-pay

## 2021-08-14 MED ORDER — ESTRADIOL 0.1 MG/GM VA CREA: 0.5000 g | TOPICAL_CREAM | VAGINAL | 12 refills | Status: DC

## 2021-08-27 ENCOUNTER — Other Ambulatory Visit: Payer: Self-pay

## 2021-08-27 ENCOUNTER — Encounter: Payer: Self-pay | Admitting: Nurse Practitioner

## 2021-08-27 ENCOUNTER — Ambulatory Visit: Payer: BC Managed Care – PPO | Admitting: Nurse Practitioner

## 2021-08-27 VITALS — BP 120/84 | HR 77 | Ht 63.0 in | Wt 146.0 lb

## 2021-08-27 DIAGNOSIS — Z1211 Encounter for screening for malignant neoplasm of colon: Secondary | ICD-10-CM | POA: Diagnosis not present

## 2021-08-27 DIAGNOSIS — N951 Menopausal and female climacteric states: Secondary | ICD-10-CM

## 2021-08-27 DIAGNOSIS — Z1509 Genetic susceptibility to other malignant neoplasm: Secondary | ICD-10-CM

## 2021-08-27 DIAGNOSIS — Z1501 Genetic susceptibility to malignant neoplasm of breast: Secondary | ICD-10-CM | POA: Diagnosis not present

## 2021-08-27 DIAGNOSIS — F3181 Bipolar II disorder: Secondary | ICD-10-CM

## 2021-08-27 DIAGNOSIS — Z853 Personal history of malignant neoplasm of breast: Secondary | ICD-10-CM

## 2021-08-27 NOTE — Progress Notes (Signed)
Subjective:    Patient ID: Kristine Ortega, female    DOB: 1966/07/02, 55 y.o.   MRN: [id number]  HPI: Kristine Ortega is a 55 y.o. female presenting for medication follow up.  Chief Complaint  Patient presents with   Depression    MENOPAUSAL SYMPTOMS Patient has a history of breast cancer of left breast.  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  Diagnosed in October 2004. Had chemotherapy, masectomy, 1 year of herceptin.  Had prophylactic hysterectomy 2007 with BSO. She has been using vaginal estrace twice per week to help with vaginal atrophy.  Also uses testosterone vaginally twice per week for postmenopausal symptoms.   Had reconstruction revision in 2018 and reports this went well. She reports she was released from Psychiatric nurse.  Today, we discussed the use of hormone replacement therapy given her history of breast cancer.  Continues on lamotrigine 150 mg twice daily and reports good control of her mood.   She is due for colon cancer screening and is requesting referral for colonoscopy.  No Known Allergies  Outpatient Encounter Medications as of 08/27/2021  Medication Sig   ALPRAZolam (XANAX) 0.5 MG tablet Take 1 tablet (0.5 mg total) by mouth every 8 (eight) hours.   Ascorbic Acid (VITAMIN C PO) Take by mouth.   BIOTIN PO Take by mouth.   estradiol (ESTRACE VAGINAL) 0.1 MG/GM vaginal cream Place 0.5 g vaginally 2 (two) times a week.   Hormone Cream Base (HRT BASE) CREA TESTOSTERONE HRT (10ML) 5% CREAM- APPLY A PEA-SIZE AMOUNT TO LABIA 5X WEEKLY   lamoTRIgine (LAMICTAL) 150 MG tablet Take 1 tablet (150 mg total) by mouth 2 (two) times daily.   MAGNESIUM-OXIDE PO Take by mouth.   MELATONIN PO Take by mouth.   OVER THE COUNTER MEDICATION chamomile, valerian root, hops blend   Rhodiola rosea (RHODIOLA PO) Take by mouth. Stress Complex   SUMAtriptan (IMITREX) 100 MG tablet TAKE 1 TABLET DAILY AS     NEEDED. MAY REPEAT IN 2    HOURS IF NO BETTER   triamcinolone  cream (KENALOG) 0.1 % Apply 1 application topically 2 (two) times daily. Do not use on face.   VITAMIN D PO Take by mouth.   Multiple Vitamins-Minerals (WOMENS DAILY FORMULA) TABS Take by mouth.   [DISCONTINUED] citalopram (CELEXA) 10 MG tablet Take 1 tablet (10 mg total) by mouth daily.   [DISCONTINUED] cyclobenzaprine (FLEXERIL) 5 MG tablet TAKE 1 OR 2 TABLETS BYSMOUTH 3 TIMES DAILY AS NEEDED.   [DISCONTINUED] dicyclomine (BENTYL) 10 MG capsule Take 1 capsule (10 mg total) by mouth 4 (four) times daily -  before meals and at bedtime.   No facility-administered encounter medications on file as of 08/27/2021.    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 Interim medical history since our last visit reviewed.  Review of Systems  Constitutional: Negative.   Respiratory: Negative.    Cardiovascular: Negative.   Genitourinary: Negative.   Skin: Negative.   Neurological: Negative.   Psychiatric/Behavioral: Negative.    Per HPI unless specifically indicated above     Objective:    BP 120/84    Pulse 77    Ht '5\' 3"'  (1.6 m)    Wt 146 lb (66.2 kg)    SpO2 99%    BMI 25.86 kg/m   Wt Readings  from Last 3 Encounters:  08/27/21 146 lb (66.2 kg)  12/18/20 147 lb (66.7 kg)  09/25/20 145 lb (65.8 kg)    Physical Exam Vitals and nursing note reviewed.  Constitutional:      General: She is not in acute distress.    Appearance: Normal appearance. She is not toxic-appearing.  Cardiovascular:     Rate and Rhythm: Normal rate and regular rhythm.     Heart sounds: Normal heart sounds. No murmur heard. Pulmonary:     Effort: Pulmonary effort is normal. No respiratory distress.     Breath sounds: Normal breath sounds. No wheezing, rhonchi or rales.  Skin:    General: Skin is warm and dry.     Coloration: Skin is not jaundiced or pale.     Findings: No erythema.  Neurological:     Mental Status: She is alert and oriented to person, place, and time.  Psychiatric:        Mood and Affect: Mood normal.        Behavior: Behavior normal.        Thought Content: Thought content normal.        Judgment: Judgment normal.      Assessment & Plan:   Problem List Items Addressed This Visit       Other   Post menopausal syndrome    Chronic vaginal atrophy.  She reports estrogen cream and testosterone cream are working well for her.  We discussed risk vs. Benefit today given her history of breast cancer.  She is aware that hormonal replacement is not recommend for people with a history of breast cancer. We discussed that the risk of topical preparations is thought to be lower, however there is still some risk for recurrence and she understands this.  She would like to continue to use the topical hormone treatments.  I offered referral to OB/GYN or Oncologist should she desire to discuss this with an specialist.  I feel it is reasonable to continue the current treatment for now.      BRCA2 positive    S/p bilateral mastectomy and BSO.  She does not require mammograms and breast implants have been evaluated in the past 10 years.  Would encourage her checking in with Plastic Surgeon about every 10  years for monitoring.        Bipolar II disorder (HCC)    Stable.  Continue Lamictal 150 mg twice daily.  Citalopram did not work well for her, however her mood is now stable.  Follow up 3 months.       Other Visit Diagnoses     History of breast cancer    -  Primary   Screening for colon cancer       Relevant Orders   Ambulatory referral to Gastroenterology  Follow up plan: Return in about 3 months (around 11/25/2021) for CPE with fasting labs.

## 2021-08-31 ENCOUNTER — Encounter: Payer: Self-pay | Admitting: Nurse Practitioner

## 2021-08-31 DIAGNOSIS — Z853 Personal history of malignant neoplasm of breast: Secondary | ICD-10-CM

## 2021-08-31 NOTE — Assessment & Plan Note (Signed)
S/p bilateral mastectomy and BSO.  She does not require mammograms and breast implants have been evaluated in the past 10 years.  Would encourage her checking in with Plastic Surgeon about every 10 years for monitoring.

## 2021-08-31 NOTE — Assessment & Plan Note (Addendum)
Chronic vaginal atrophy.  She reports estrogen cream and testosterone cream are working well for her.  We discussed risk vs. Benefit today given her history of breast cancer.  She is aware that hormonal replacement is not recommend for people with a history of breast cancer. We discussed that the risk of topical preparations is thought to be lower, however there is still some risk for recurrence and she understands this.  She would like to continue to use the topical hormone treatments.  I offered referral to OB/GYN or Oncologist should she desire to discuss this with an specialist.  I feel it is reasonable to continue the current treatment for now.

## 2021-08-31 NOTE — Assessment & Plan Note (Signed)
Stable.  Continue Lamictal 150 mg twice daily.  Citalopram did not work well for her, however her mood is now stable.  Follow up 3 months.

## 2021-09-09 ENCOUNTER — Telehealth: Payer: Self-pay | Admitting: Hematology and Oncology

## 2021-09-09 NOTE — Telephone Encounter (Signed)
Scheduled appt per 12/28 referral. Spoke to Kristine Ortega, she had originally requested Dr. Alvy Bimler. I did explain to Kristine Ortega that Dr. Alvy Bimler does not normally see our Breast Kristine Ortega's. Kristine Ortega opted to go with Dr. Chryl Heck after I had explained this to her. Kristine Ortega is aware of appt date and time and will be here 15 mins prior to appt time.

## 2021-09-15 ENCOUNTER — Encounter: Payer: Self-pay | Admitting: Internal Medicine

## 2021-09-18 ENCOUNTER — Encounter: Payer: Self-pay | Admitting: Nurse Practitioner

## 2021-09-18 ENCOUNTER — Telehealth: Payer: Self-pay

## 2021-09-18 NOTE — Telephone Encounter (Signed)
Patient came to the office concerned about her medication Lamictal.  She says she thought the dose was 100 and has been taking 2 tablets.  Today she noticed that the dose was 150mg  and she has been taking 2 tablets.  Patient would like to know how to proceed.  Advised patient an appointment may be warranted.

## 2021-09-18 NOTE — Telephone Encounter (Signed)
Please let her know that Lamictal 150 mg twice daily is a safe dose.  It looks like she has been on the 150 mg dosing since March 2021 in different forms. Last April, we switched her from Lamictal 100 mg 1.5 tablets twice daily to the 150 mg tablets twice daily.  Essentially, we did not change the dose, just the number of pills she takes at that time.  If her mood is stable with this dosing, it is safe to continue.

## 2021-09-18 NOTE — Telephone Encounter (Signed)
Left message informing patient of recommendations.

## 2021-10-06 NOTE — Progress Notes (Deleted)
[place] NOTE  Patient Care Team: Eulogio Bear, NP as PCP - General (Nurse Practitioner)  CHIEF COMPLAINTS/PURPOSE OF CONSULTATION:  Newly diagnosed breast cancer  HISTORY OF PRESENTING ILLNESS:  Kristine Ortega 56 y.o. female is here because of recent diagnosis of {left/right:[id number]}   I reviewed her records extensively and collaborated the history with the patient.  SUMMARY OF ONCOLOGIC HISTORY: Oncology History   No history exists.   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  She had neoadjuvant Adriamycin/Cytoxan/Taxotere with 3 cycles had left mastectomy with no residual tumor and 3 sentinel lymph nodes negative.  She also had prophylactic right mastectomy by Dr. Margot Chimes.  She had additional 3 cycles of same chemotherapy after surgery, no radiation and then received a year of Herceptin.  She was BRCA2 positive hence had prophylactic hysterectomy with bilateral salpingo-oophorectomy in December 2007.  She since then has been followed on observation, last seen by Dr. Marko Plume in 2014. Last MRI in September 2016, no suspicious enhancement in either breast.  Postop changes following bilateral mastectomy and subpectoral implant reconstruction.  Colonoscopy? Dermatology follow-up?  MEDICAL HISTORY:  Past Medical History:  Diagnosis Date   Anxiety    Cancer ([place])    breast cancer   Complication of anesthesia    Depression    Headache(784.0)    Motion sickness    PONV (postoperative nausea and vomiting)     SURGICAL HISTORY: Past Surgical History:  Procedure Laterality Date   ABDOMINAL HYSTERECTOMY  2007   BSO   BREAST IMPLANT REMOVAL Bilateral 07/15/2017   Procedure: REMOVAL BREAST IMPLANTS;  Surgeon: Wallace Going, DO;  Location: [facility];  Service: Plastics;  Laterality:  Bilateral;   BREAST RECONSTRUCTION  2005   implants-bilat   CARPAL TUNNEL RELEASE Right 05/16/2013   Procedure: CARPAL TUNNEL RELEASE RIGHT ;  Surgeon: Cammie Sickle., MD;  Location: [facility];  Service: Orthopedics;  Laterality: Right;   CARPAL TUNNEL RELEASE Left 08/08/2013   Procedure: LEFT CARPAL TUNNEL RELEASE;  Surgeon: Cammie Sickle., MD;  Location: [facility];  Service: Orthopedics;  Laterality: Left;   CESAREAN SECTION     x4   MASTECTOMY W/ SENTINEL NODE BIOPSY  2005   left-   PLACEMENT OF BREAST IMPLANTS Bilateral 07/15/2017   Procedure: PLACEMENT OF BREAST IMPLANTS;  Surgeon: Wallace Going, DO;  Location: [facility];  Service: Plastics;  Laterality: Bilateral;   PORT-A-CATH REMOVAL  2007   insertion then out   SIMPLE MASTECTOMY  2005   right-prophalactic   TONSILLECTOMY      SOCIAL HISTORY: Social History   Socioeconomic History   Marital status: Married    Spouse name: Not on file   Number of children: Not on file   Years of education: Not on file   Highest education level: Not on file  Occupational History   Not on file  Tobacco Use   Smoking status: Never   Smokeless tobacco: Never  Substance and Sexual Activity   Alcohol use: No   Drug use: No   Sexual activity: Yes  Other Topics Concern   Not on file  Social History Narrative   Not on file   Social Determinants of Health   Financial Resource Strain: Not on file  Food Insecurity: Not  on file  Transportation Needs: Not on file  Physical Activity: Not on file  Stress: Not on file  Social Connections: Not on file  Intimate Partner Violence: Not on file    FAMILY HISTORY: Family History  Problem Relation Age of Onset   Diabetes Mother    Hypertension Mother    Cancer Father        prostate   Stroke Father     ALLERGIES:  has No Known Allergies.  MEDICATIONS:  Current Outpatient Medications  Medication Sig Dispense Refill    ALPRAZolam (XANAX) 0.5 MG tablet Take 1 tablet (0.5 mg total) by mouth every 8 (eight) hours. 30 tablet 0   Ascorbic Acid (VITAMIN C PO) Take by mouth.     BIOTIN PO Take by mouth.     estradiol (ESTRACE VAGINAL) 0.1 MG/GM vaginal cream Place 0.5 g vaginally 2 (two) times a week. 42.5 g 12   Hormone Cream Base (HRT BASE) CREA TESTOSTERONE HRT (10ML) 5% CREAM- APPLY A PEA-SIZE AMOUNT TO LABIA 5X WEEKLY 1000 g 11   lamoTRIgine (LAMICTAL) 150 MG tablet Take 1 tablet (150 mg total) by mouth 2 (two) times daily. 180 tablet 1   MAGNESIUM-OXIDE PO Take by mouth.     MELATONIN PO Take by mouth.     Multiple Vitamins-Minerals (WOMENS DAILY FORMULA) TABS Take by mouth.     OVER THE COUNTER MEDICATION chamomile, valerian root, hops blend     Rhodiola rosea (RHODIOLA PO) Take by mouth. Stress Complex     SUMAtriptan (IMITREX) 100 MG tablet TAKE 1 TABLET DAILY AS     NEEDED. MAY REPEAT IN 2    HOURS IF NO BETTER 90 tablet 3   triamcinolone cream (KENALOG) 0.1 % Apply 1 application topically 2 (two) times daily. Do not use on face. 30 g 0   VITAMIN D PO Take by mouth.     No current facility-administered medications for this visit.    REVIEW OF SYSTEMS:   Constitutional: Denies fevers, chills or abnormal night sweats Eyes: Denies blurriness of vision, double vision or watery eyes Ears, nose, mouth, throat, and face: Denies mucositis or sore throat Respiratory: Denies cough, dyspnea or wheezes Cardiovascular: Denies palpitation, chest discomfort or lower extremity swelling Gastrointestinal:  Denies nausea, heartburn or change in bowel habits Skin: Denies abnormal skin rashes Lymphatics: Denies new lymphadenopathy or easy bruising Neurological:Denies numbness, tingling or new weaknesses Behavioral/Psych: Mood is stable, no new changes  Breast: *** Denies any palpable lumps or discharge All other systems were reviewed with the patient and are negative.  PHYSICAL EXAMINATION: ECOG PERFORMANCE STATUS:  {CHL ONC ECOG [phone]}  There were no vitals filed for this visit. There were no vitals filed for this visit.  GENERAL:alert, no distress and comfortable SKIN: skin color, texture, turgor are normal, no rashes or significant lesions EYES: normal, conjunctiva are pink and non-injected, sclera clear OROPHARYNX:no exudate, no erythema and lips, buccal mucosa, and tongue normal  NECK: supple, thyroid normal size, non-tender, without nodularity LYMPH:  no palpable lymphadenopathy in the cervical, axillary or inguinal LUNGS: clear to auscultation and percussion with normal breathing effort HEART: regular rate & rhythm and no murmurs and no lower extremity edema ABDOMEN:abdomen soft, non-tender and normal bowel sounds Musculoskeletal:no cyanosis of digits and no clubbing  PSYCH: alert & oriented x 3 with fluent speech NEURO: no focal motor/sensory deficits BREAST:*** No palpable nodules in breast. No palpable axillary or supraclavicular lymphadenopathy (exam performed in the presence of a chaperone)  LABORATORY DATA:  I have reviewed the data as listed Lab Results  Component Value Date   WBC 5.7 12/18/2020   HGB 13.6 12/18/2020   HCT 41.4 12/18/2020   MCV 86.3 12/18/2020   PLT 249 12/18/2020   Lab Results  Component Value Date   NA 142 12/18/2020   K 4.7 12/18/2020   CL 106 12/18/2020   CO2 27 12/18/2020    RADIOGRAPHIC STUDIES: I have personally reviewed the radiological reports and agreed with the findings in the report.  ASSESSMENT AND PLAN:  No problem-specific Assessment & Plan notes found for this encounter.   All questions were answered. The patient knows to call the clinic with any problems, questions or concerns.    Benay Pike, MD 10/06/21

## 2021-10-07 ENCOUNTER — Inpatient Hospital Stay: Payer: BC Managed Care – PPO

## 2021-10-07 ENCOUNTER — Inpatient Hospital Stay: Payer: BC Managed Care – PPO | Admitting: Hematology and Oncology

## 2021-10-07 ENCOUNTER — Telehealth: Payer: Self-pay | Admitting: Hematology and Oncology

## 2021-10-07 NOTE — Telephone Encounter (Signed)
R/s pt's new pt appt with Dr. Chryl Heck due to provider being unexpectedly out of the office. Spoke to pt, she is aware of new appt date and time. I did offer her an earlier day, however 2/16 worked best for her schedule.

## 2021-10-12 ENCOUNTER — Other Ambulatory Visit: Payer: Self-pay | Admitting: Nurse Practitioner

## 2021-10-12 DIAGNOSIS — F3181 Bipolar II disorder: Secondary | ICD-10-CM

## 2021-10-16 ENCOUNTER — Encounter: Payer: Self-pay | Admitting: Nurse Practitioner

## 2021-10-16 ENCOUNTER — Other Ambulatory Visit: Payer: Self-pay | Admitting: Nurse Practitioner

## 2021-10-16 MED ORDER — ESTRADIOL 0.1 MG/GM VA CREA: 0.5000 g | TOPICAL_CREAM | VAGINAL | 3 refills | Status: DC

## 2021-10-16 MED ORDER — HRT BASE CREA: TOPICAL_CREAM | 3 refills | Status: DC

## 2021-10-16 NOTE — Telephone Encounter (Signed)
As far as I am aware, all we need to know is the name of the mail order pharmacy and we can send the prescription in.

## 2021-10-16 NOTE — Telephone Encounter (Signed)
Orders sent in

## 2021-10-16 NOTE — Telephone Encounter (Signed)
[facility] is already in her pharmacy list.

## 2021-10-22 NOTE — Progress Notes (Signed)
[facility] °CONSULT NOTE ° °Patient Care Team: °Martinez, Jessica A, NP as PCP - General (Nurse Practitioner) ° °CHIEF COMPLAINTS/PURPOSE OF CONSULTATION:  °Newly diagnosed breast cancer ° °HISTORY OF PRESENTING ILLNESS:  °Kristine Ortega 56 y.o. female is here because of remote diagnosis of left breast cancer. ° °This is a very pleasant 56 yr old female patient with remote history of left breast cancer in 2004, She remembers it as stage II, grade 3, negative lymph node ,ER, PR negative, Her 2 amplified, BRCA 2 mutated s/p bilateral mastectomy followed by adjuvant Adriamcyin, cytoxan, taxotere and Herceptin adjuvant chemotherapy followed by Herceptin for a total of 1 year followed by reconstruction, had salpingo-oophorectomy as well who is here for follow-up in the breast clinic.  She apparently was recommended by her primary care physician to consider evaluation in the breast clinic periodically. °She is a good historian.  Most of the above-mentioned history was obtained by the patient, we did not have these records since they were way back from 2004. °Today she denies any new health complaints.  She has been feeling well.  She has followed up with plastic surgery last in 2019, satisfied with the reconstruction, denies any new breast changes or other health symptoms.  Rest of the pertinent 10 point ROS reviewed and negative. °I reviewed her records extensively and collaborated the history with the patient. ° °SUMMARY OF ONCOLOGIC HISTORY: °Oncology History  ° No history exists.  ° ° ° °MEDICAL HISTORY:  °Past Medical History:  °Diagnosis Date  ° Anxiety   ° Cancer ([facility])   ° breast cancer  ° Complication of anesthesia   ° Depression   ° Headache(784.0)   ° Motion sickness   ° PONV (postoperative nausea and vomiting)   ° ° °SURGICAL HISTORY: °Past Surgical History:  °Procedure Laterality Date  ° ABDOMINAL HYSTERECTOMY  2007  ° BSO  ° BREAST IMPLANT REMOVAL Bilateral 07/15/2017  ° Procedure: REMOVAL  BREAST IMPLANTS;  Surgeon: Dillingham, Claire S, DO;  Location: [facility];  Service: Plastics;  Laterality: Bilateral;  ° BREAST RECONSTRUCTION  2005  ° implants-bilat  ° CARPAL TUNNEL RELEASE Right 05/16/2013  ° Procedure: CARPAL TUNNEL RELEASE RIGHT ;  Surgeon: Robert V Sypher Jr., MD;  Location: [facility];  Service: Orthopedics;  Laterality: Right;  ° CARPAL TUNNEL RELEASE Left 08/08/2013  ° Procedure: LEFT CARPAL TUNNEL RELEASE;  Surgeon: Robert V Sypher Jr., MD;  Location: [facility];  Service: Orthopedics;  Laterality: Left;  ° CESAREAN SECTION    ° x4  ° MASTECTOMY W/ SENTINEL NODE BIOPSY  2005  ° left-  ° PLACEMENT OF BREAST IMPLANTS Bilateral 07/15/2017  ° Procedure: PLACEMENT OF BREAST IMPLANTS;  Surgeon: Dillingham, Claire S, DO;  [facility];  Service: Plastics;  Laterality: Bilateral;  ° PORT-A-CATH REMOVAL  2007  ° insertion then out  ° SIMPLE MASTECTOMY  2005  ° right-prophalactic  ° TONSILLECTOMY    ° ° °SOCIAL HISTORY: °Social History  ° °Socioeconomic History  ° Marital status: Married  °  Spouse name: Not on file  ° Number of children: Not on file  ° Years of education: Not on file  ° Highest education level: Not on file  °Occupational History  ° Not on file  °Tobacco Use  ° Smoking status: Never  ° Smokeless tobacco: Never  °Substance and Sexual Activity  ° Alcohol use: No  ° Drug use: No  ° Sexual activity: Yes  °  Other Topics Concern  ° Not on file  °Social History Narrative  ° Not on file  ° °Social Determinants of Health  ° °Financial Resource Strain: Not on file  °Food Insecurity: Not on file  °Transportation Needs: Not on file  °Physical Activity: Not on file  °Stress: Not on file  °Social Connections: Not on file  °Intimate Partner Violence: Not on file  ° ° °FAMILY HISTORY: °Family History  °Problem Relation Age of Onset  ° Diabetes Mother   ° Hypertension Mother   ° Cancer Father   °     prostate  ° Stroke Father    ° ° °ALLERGIES:  has No Known Allergies. ° °MEDICATIONS:  °Current Outpatient Medications  °Medication Sig Dispense Refill  ° ALPRAZolam (XANAX) 0.5 MG tablet Take 1 tablet (0.5 mg total) by mouth every 8 (eight) hours. 30 tablet 0  ° Ascorbic Acid (VITAMIN C PO) Take by mouth.    ° BIOTIN PO Take by mouth.    ° estradiol (ESTRACE VAGINAL) 0.1 MG/GM vaginal cream Place 0.5 g vaginally 2 (two) times a week. 42.5 g 3  ° Hormone Cream Base (HRT BASE) CREA TESTOSTERONE HRT (10ML) 5% CREAM- APPLY A PEA-SIZE AMOUNT TO LABIA 5X WEEKLY 1000 g 3  ° lamoTRIgine (LAMICTAL) 150 MG tablet TAKE 1 TABLET TWICE A DAY 180 tablet 1  ° MAGNESIUM-OXIDE PO Take by mouth.    ° MELATONIN PO Take by mouth.    ° Multiple Vitamins-Minerals (WOMENS DAILY FORMULA) TABS Take by mouth.    ° OVER THE COUNTER MEDICATION chamomile, valerian root, hops blend    ° Rhodiola rosea (RHODIOLA PO) Take by mouth. Stress Complex    ° SUMAtriptan (IMITREX) 100 MG tablet TAKE 1 TABLET DAILY AS     NEEDED. MAY REPEAT IN 2    HOURS IF NO BETTER 90 tablet 3  ° triamcinolone cream (KENALOG) 0.1 % Apply 1 application topically 2 (two) times daily. Do not use on face. 30 g 0  ° VITAMIN D PO Take by mouth.    ° °No current facility-administered medications for this visit.  ° °Rest of the pertinent 10 point ROS reviewed and negative. ° °PHYSICAL EXAMINATION: °ECOG PERFORMANCE STATUS: 0 - Asymptomatic ° °Vitals:  ° 10/23/21 1008  °BP: 114/83  °Pulse: 88  °Resp: 16  °Temp: 97.7 °F (36.5 °C)  °SpO2: 99%  ° °Filed Weights  ° 10/23/21 1008  °Weight: 146 lb 11.2 oz (66.5 kg)  ° ° °GENERAL:alert, no distress and comfortable °EYES: normal, conjunctiva are pink and non-injected, sclera clear °OROPHARYNX:no exudate, no erythema and lips, buccal mucosa, and tongue normal  °NECK: supple, thyroid normal size, non-tender, without nodularity, no palpable lymphadenopathy °LYMPH:  no palpable lymphadenopathy in the cervical, axillary °LUNGS: clear to auscultation and percussion with  normal breathing effort °HEART: regular rate & rhythm and no murmurs and no lower extremity edema °PSYCH: alert & oriented x 3 with fluent speech °NEURO: no focal motor/sensory deficits °BREAST: Bilateral implants inspected.  No concerns.  No palpable masses in the skin.  No regional adenopathy ° °LABORATORY DATA:  °I have reviewed the data as listed °Lab Results  °Component Value Date  ° WBC 5.7 12/18/2020  ° HGB 13.6 12/18/2020  ° HCT 41.4 12/18/2020  ° MCV 86.3 12/18/2020  ° PLT 249 12/18/2020  ° °Lab Results  °Component Value Date  ° NA 142 12/18/2020  ° K 4.7 12/18/2020  ° CL 106 12/18/2020  ° CO2 27 12/18/2020  ° ° °  RADIOGRAPHIC STUDIES: I have personally reviewed the radiological reports and agreed with the findings in the report.  ASSESSMENT AND PLAN:  This is a very pleasant 56 year old female patient with past medical history significant for left breast cancer back in 2004, ER/PR negative, HER2 amplified, BRCA2 mutated, status post bilateral mastectomy, adjuvant chemotherapy with Adriamycin, Cytoxan, Taxotere and Herceptin followed by adjuvant Herceptin for 1 year, bilateral salpingo-oophorectomy and hysterectomy referred to breast center for follow-up recommendations.  Today we have discussed about BRCA2 mutation in detail, patient was wondering if she can get a refresher about the BRCA2 mutation.  Since she had bilateral mastectomy and salpingo-oophorectomy, her risk of breast cancer and ovarian cancer is considerably decreased.  We have also discussed about other cancers such as mentioned below.  She is already following up with dermatology annually.  I have also referred her to gastroenterology for consideration of annual pancreatic cancer screening although there is no family history of pancreatic cancer.  At this time since her breast cancer was almost 19 years ago, she does not need routine follow-up in the breast clinic.  There appears to be no evidence of breast cancer recurrence on exam  today.  She can continue exams annually with her primary care physician.  She was however encouraged to call us with any new questions or concerns.   1. 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 Uterine papillary serous carcinoma: Overall risk is very low 4. Colorectal cancers: In many studies there was no increased risk But there may be some for BRCA1 carriers 5. Melanoma and other skin cancers: The risk of oatmeal melanoma is increase in BRCA2 mutation carriers but it still extremely rare. 6. Endometrial cancer: Not very clear in terms of risks it is thought to be very low, this does not apply to her since she had hysterectomy.  She was recommended to continue colonoscopy as recommended by her gastroenterologist.  No prior abnormal colonoscopy. Thank you for consulting [place] in the care of this patient.  Please do not hesitate to contact us with any additional questions or concerns.  All questions were answered. The patient knows to call the clinic with any problems, questions or concerns.    Benay Pike, MD 10/23/21

## 2021-10-23 ENCOUNTER — Other Ambulatory Visit: Payer: Self-pay

## 2021-10-23 ENCOUNTER — Inpatient Hospital Stay: Payer: BC Managed Care – PPO | Attending: Hematology and Oncology | Admitting: Hematology and Oncology

## 2021-10-23 ENCOUNTER — Inpatient Hospital Stay: Payer: BC Managed Care – PPO

## 2021-10-23 ENCOUNTER — Encounter: Payer: Self-pay | Admitting: Hematology and Oncology

## 2021-10-23 VITALS — BP 114/83 | HR 88 | Temp 97.7°F | Resp 16 | Ht 63.0 in | Wt 146.7 lb

## 2021-10-23 DIAGNOSIS — Z8042 Family history of malignant neoplasm of prostate: Secondary | ICD-10-CM | POA: Diagnosis not present

## 2021-10-23 DIAGNOSIS — Z853 Personal history of malignant neoplasm of breast: Secondary | ICD-10-CM | POA: Diagnosis not present

## 2021-10-23 DIAGNOSIS — Z90710 Acquired absence of both cervix and uterus: Secondary | ICD-10-CM | POA: Diagnosis not present

## 2021-10-23 DIAGNOSIS — Z1501 Genetic susceptibility to malignant neoplasm of breast: Secondary | ICD-10-CM

## 2021-10-30 ENCOUNTER — Ambulatory Visit: Payer: BC Managed Care – PPO

## 2021-11-13 ENCOUNTER — Other Ambulatory Visit: Payer: Self-pay

## 2021-11-13 ENCOUNTER — Ambulatory Visit: Payer: BC Managed Care – PPO | Admitting: Family Medicine

## 2021-11-13 ENCOUNTER — Encounter: Payer: Self-pay | Admitting: Family Medicine

## 2021-11-13 VITALS — BP 128/82 | HR 93 | Temp 97.3°F | Resp 18 | Ht 63.0 in | Wt 146.0 lb

## 2021-11-13 DIAGNOSIS — F411 Generalized anxiety disorder: Secondary | ICD-10-CM

## 2021-11-13 DIAGNOSIS — F3181 Bipolar II disorder: Secondary | ICD-10-CM | POA: Diagnosis not present

## 2021-11-13 MED ORDER — CARIPRAZINE HCL 1.5 MG PO CAPS: 1.5000 mg | ORAL_CAPSULE | Freq: Every day | ORAL | 3 refills | Status: DC

## 2021-11-13 NOTE — Progress Notes (Signed)
? ?Subjective:  ? ? Patient ID: Kristine Ortega, female    DOB: [birth year], 56 y.o.   MRN: [id number] ? ?Anxiety ? ? ? ?Patient has been seeing my partner who has since retired.  The other physician that she would see has recently left the practice.  Therefore she is here to establish care with me.  She has a history of bipolar 2 disorder.  She is currently on Lamictal 150 mg twice daily.  In the past she had severe issues with anxiety.  She questions if she is truly bipolar.  However, she did poorly on antidepressants in the past and only improved after she started taking mood stabilizers.  She has done well for several years however recently she feels that the anxiety is starting to build inside her again.  She states that she feels like she is a washing machine on a spin cycle.  She reports racing thoughts.  She reports that she is always anxious and worrying.  She states that she still sleeping.  However her mind does tend to race at night.  She denies any hallucinations.  She denies any delusions.  She denies any depression.  However she feels like she is shaking constantly on the outside.  She states feels like she is shaking constantly on the inside.  She always feels nervous and overwhelmed when she cannot breathe and is on the verge of a panic attack.  She does have a history of generalized anxiety disorder but she is afraid to take Xanax.  She seldom uses this.  Today there is no pressured speech or tangential speech.  She seems anxious and scared.  She states that she does not want to get as bad as she was before.  A few years ago, she feels like everything got out of control.  Bipolar does run in her family.  She states that her mother was diagnosed with bipolar. ?Past Medical History:  ?Diagnosis Date  ? Anxiety   ? Cancer [facility])   ? breast cancer  ? Complication of anesthesia   ? Depression   ? Headache(784.0)   ? Motion sickness   ? PONV (postoperative nausea and vomiting)   ? ?Past Surgical History:   ?Procedure Laterality Date  ? ABDOMINAL HYSTERECTOMY  2007  ? BSO  ? BREAST IMPLANT REMOVAL Bilateral 07/15/2017  ? Procedure: REMOVAL BREAST IMPLANTS;  Surgeon: Wallace Going, DO;  Location: [facility];  Service: Plastics;  Laterality: Bilateral;  ? BREAST RECONSTRUCTION  2005  ? implants-bilat  ? CARPAL TUNNEL RELEASE Right 05/16/2013  ? Procedure: CARPAL TUNNEL RELEASE RIGHT ;  Surgeon: Cammie Sickle., MD;  Location: [facility];  Service: Orthopedics;  Laterality: Right;  ? CARPAL TUNNEL RELEASE Left 08/08/2013  ? Procedure: LEFT CARPAL TUNNEL RELEASE;  Surgeon: Cammie Sickle., MD;  Location: [facility];  Service: Orthopedics;  Laterality: Left;  ? CESAREAN SECTION    ? x4  ? MASTECTOMY W/ SENTINEL NODE BIOPSY  2005  ? left-  ? PLACEMENT OF BREAST IMPLANTS Bilateral 07/15/2017  ? Procedure: PLACEMENT OF BREAST IMPLANTS;  Surgeon: Wallace Going, DO;  Location: [facility];  Service: Plastics;  Laterality: Bilateral;  ? PORT-A-CATH REMOVAL  2007  ? insertion then out  ? SIMPLE MASTECTOMY  2005  ? right-prophalactic  ? TONSILLECTOMY    ? ?Current Outpatient Medications on File Prior to Visit  ?Medication Sig Dispense Refill  ? ALPRAZolam (XANAX) 0.5  MG tablet Take 1 tablet (0.5 mg total) by mouth every 8 (eight) hours. 30 tablet 0  ? Ascorbic Acid (VITAMIN C PO) Take by mouth.    ? BIOTIN PO Take by mouth.    ? estradiol (ESTRACE VAGINAL) 0.1 MG/GM vaginal cream Place 0.5 g vaginally 2 (two) times a week. 42.5 g 3  ? Hormone Cream Base (HRT BASE) CREA TESTOSTERONE HRT (10ML) 5% CREAM- APPLY A PEA-SIZE AMOUNT TO LABIA 5X WEEKLY 1000 g 3  ? lamoTRIgine (LAMICTAL) 150 MG tablet TAKE 1 TABLET TWICE A DAY 180 tablet 1  ? MAGNESIUM-OXIDE PO Take by mouth.    ? MELATONIN PO Take by mouth.    ? Multiple Vitamins-Minerals (WOMENS DAILY FORMULA) TABS Take by mouth.    ? OVER THE COUNTER MEDICATION chamomile, valerian root, hops blend    ? Rhodiola  rosea (RHODIOLA PO) Take by mouth. Stress Complex    ? SUMAtriptan (IMITREX) 100 MG tablet TAKE 1 TABLET DAILY AS     NEEDED. MAY REPEAT IN 2    HOURS IF NO BETTER 90 tablet 3  ? VITAMIN D PO Take by mouth.    ? ?No current facility-administered medications on file prior to visit.  ? ?No Known Allergies ?Social History  ? ?Socioeconomic History  ? Marital status: Married  ?  Spouse name: Not on file  ? Number of children: Not on file  ? Years of education: Not on file  ? Highest education level: Not on file  ?Occupational History  ? Not on file  ?Tobacco Use  ? Smoking status: Never  ? Smokeless tobacco: Never  ?Substance and Sexual Activity  ? Alcohol use: No  ? Drug use: No  ? Sexual activity: Yes  ?Other Topics Concern  ? Not on file  ?Social History Narrative  ? Not on file  ? ?Social Determinants of Health  ? ?Financial Resource Strain: Not on file  ?Food Insecurity: Not on file  ?Transportation Needs: Not on file  ?Physical Activity: Not on file  ?Stress: Not on file  ?Social Connections: Not on file  ?Intimate Partner Violence: Not on file  ? ? ? ? ?Review of Systems  ?All other systems reviewed and are negative. ? ?   ?Objective:  ? Physical Exam ?Vitals reviewed.  ?Constitutional:   ?   General: She is not in acute distress. ?   Appearance: Normal appearance. She is not ill-appearing or toxic-appearing.  ?Cardiovascular:  ?   Rate and Rhythm: Normal rate and regular rhythm.  ?   Heart sounds: Normal heart sounds. No murmur heard. ?  No friction rub. No gallop.  ?Neurological:  ?   Mental Status: She is alert.  ?Psychiatric:     ?   Attention and Perception: Attention and perception normal. She is attentive. She does not perceive auditory or visual hallucinations.     ?   Mood and Affect: Mood is anxious. Mood is not elated. Affect is tearful. Affect is not labile, blunt, flat, angry or inappropriate.     ?   Speech: Speech normal. She is communicative. Speech is not rapid and pressured, delayed or slurred.      ?   Behavior: Behavior normal.     ?   Thought Content: Thought content normal.     ?   Cognition and Memory: Cognition and memory normal.     ?   Judgment: Judgment normal. Judgment is not impulsive or inappropriate.  ? ? ? ? ? ?   ?  Assessment & Plan:  ?Bipolar II disorder ([place]) ? ?GAD (generalized anxiety disorder) ?I only have secondhand knowledge of her previous episode several years ago.  However at that time, the patient was manic based on my recollection of discussions that I had with her previous physician.  I do feel that she would benefit from a better mood stabilizer, start the patient on Vraylar 1.5 mg daily.  I have encouraged her to use Xanax 0.5 mg as needed for panic.  I recommended that she not wait until the symptoms spiraled out of control and she has a full-blown panic attack before she uses the medication.  I would like to see her back in 2 weeks to see how she is doing or sooner if worsening.  My hope eventually is to wean her away from [place] and to maintain the patient on Vraylar. ? ?

## 2021-11-28 ENCOUNTER — Ambulatory Visit: Payer: BC Managed Care – PPO | Admitting: Family Medicine

## 2021-11-28 ENCOUNTER — Other Ambulatory Visit: Payer: Self-pay

## 2021-11-28 VITALS — BP 138/60 | HR 88 | Temp 97.4°F | Ht 63.0 in | Wt 146.8 lb

## 2021-11-28 DIAGNOSIS — E7800 Pure hypercholesterolemia, unspecified: Secondary | ICD-10-CM | POA: Diagnosis not present

## 2021-11-28 DIAGNOSIS — F314 Bipolar disorder, current episode depressed, severe, without psychotic features: Secondary | ICD-10-CM | POA: Diagnosis not present

## 2021-11-28 MED ORDER — HYDROXYZINE PAMOATE 25 MG PO CAPS: 25.0000 mg | ORAL_CAPSULE | Freq: Three times a day (TID) | ORAL | 0 refills | Status: DC | PRN

## 2021-11-28 NOTE — Progress Notes (Signed)
? ?Subjective:  ? ? Patient ID: Kristine Ortega, female    DOB: February 05, 1966, 56 y.o.   MRN: [id number] ? ?Anxiety ? ? ?11/13/21 ?Patient has been seeing my partner who has since retired.  The other physician that she would see has recently left the practice.  Therefore she is here to establish care with me.  She has a history of bipolar 2 disorder.  She is currently on Lamictal 150 mg twice daily.  In the past she had severe issues with anxiety.  She questions if she is truly bipolar.  However, she did poorly on antidepressants in the past and only improved after she started taking mood stabilizers.  She has done well for several years however recently she feels that the anxiety is starting to build inside her again.  She states that she feels like she is a washing machine on a spin cycle.  She reports racing thoughts.  She reports that she is always anxious and worrying.  She states that she still sleeping.  However her mind does tend to race at night.  She denies any hallucinations.  She denies any delusions.  She denies any depression.  However she feels like she is shaking constantly on the outside.  She states feels like she is shaking constantly on the inside.  She always feels nervous and overwhelmed when she cannot breathe and is on the verge of a panic attack.  She does have a history of generalized anxiety disorder but she is afraid to take Xanax.  She seldom uses this.  Today there is no pressured speech or tangential speech.  She seems anxious and scared.  She states that she does not want to get as bad as she was before.  A few years ago, she feels like everything got out of control.  Bipolar does run in her family.  She states that her mother was diagnosed with bipolar.  At that time, my plan was: ? ?I only have secondhand knowledge of her previous episode several years ago.  However at that time, the patient was manic based on my recollection of discussions that I had with her previous physician.  I do  feel that she would benefit from a better mood stabilizer, start the patient on Vraylar 1.5 mg daily.  I have encouraged her to use Xanax 0.5 mg as needed for panic.  I recommended that she not wait until the symptoms spiraled out of control and she has a full-blown panic attack before she uses the medication.  I would like to see her back in 2 weeks to see how she is doing or sooner if worsening.  My hope eventually is to wean her away from [place] and to maintain the patient on Vraylar. ? ?11/28/21 ?Patient states that she is doing better on the Vraylar.  She states that the anxiety has improved.  She is still not sleeping well.  She is still dealing with anxiety but her symptoms have improved substantially.  She is interesting in pursuing disability.  She states that she just has a difficult time focusing and managing her anxiety to be able to work.  She has not followed up with her psychiatrist in years.  She states that she had a bad experience with her previous psychiatrist.  I recommended that she see a psychiatrist before pursuing disability just to see if they can better help manage her symptoms.  She also has a history of severe hyperlipidemia.  She refuses a statin.  She has a strong family history of coronary artery disease ?Past Medical History:  ?Diagnosis Date  ? Anxiety   ? Cancer [facility])   ? breast cancer  ? Complication of anesthesia   ? Depression   ? Headache(784.0)   ? Motion sickness   ? PONV (postoperative nausea and vomiting)   ? ?Past Surgical History:  ?Procedure Laterality Date  ? ABDOMINAL HYSTERECTOMY  2007  ? BSO  ? BREAST IMPLANT REMOVAL Bilateral 07/15/2017  ? Procedure: REMOVAL BREAST IMPLANTS;  Surgeon: Wallace Going, DO;  Location: [place];  Service: Plastics;  Laterality: Bilateral;  ? BREAST RECONSTRUCTION  2005  ? implants-bilat  ? CARPAL TUNNEL RELEASE Right 05/16/2013  ? Procedure: CARPAL TUNNEL RELEASE RIGHT ;  Surgeon: Cammie Sickle., MD;  Location:  [place];  Service: Orthopedics;  Laterality: Right;  ? CARPAL TUNNEL RELEASE Left 08/08/2013  ? Procedure: LEFT CARPAL TUNNEL RELEASE;  Surgeon: Cammie Sickle., MD;  Location: [place];  Service: Orthopedics;  Laterality: Left;  ? CESAREAN SECTION    ? x4  ? MASTECTOMY W/ SENTINEL NODE BIOPSY  2005  ? left-  ? PLACEMENT OF BREAST IMPLANTS Bilateral 07/15/2017  ? Procedure: PLACEMENT OF BREAST IMPLANTS;  Surgeon: Wallace Going, DO;  Location: [place];  Service: Plastics;  Laterality: Bilateral;  ? PORT-A-CATH REMOVAL  2007  ? insertion then out  ? SIMPLE MASTECTOMY  2005  ? right-prophalactic  ? TONSILLECTOMY    ? ?Current Outpatient Medications on File Prior to Visit  ?Medication Sig Dispense Refill  ? ALPRAZolam (XANAX) 0.5 MG tablet Take 1 tablet (0.5 mg total) by mouth every 8 (eight) hours. 30 tablet 0  ? Ascorbic Acid (VITAMIN C PO) Take by mouth.    ? BIOTIN PO Take by mouth.    ? cariprazine (VRAYLAR) 1.5 MG capsule Take 1 capsule (1.5 mg total) by mouth daily. 30 capsule 3  ? estradiol (ESTRACE VAGINAL) 0.1 MG/GM vaginal cream Place 0.5 g vaginally 2 (two) times a week. 42.5 g 3  ? Hormone Cream Base (HRT BASE) CREA TESTOSTERONE HRT (10ML) 5% CREAM- APPLY A PEA-SIZE AMOUNT TO LABIA 5X WEEKLY 1000 g 3  ? lamoTRIgine (LAMICTAL) 150 MG tablet TAKE 1 TABLET TWICE A DAY 180 tablet 1  ? MAGNESIUM-OXIDE PO Take by mouth.    ? MELATONIN PO Take by mouth.    ? Multiple Vitamins-Minerals (WOMENS DAILY FORMULA) TABS Take by mouth.    ? OVER THE COUNTER MEDICATION chamomile, valerian root, hops blend    ? Rhodiola rosea (RHODIOLA PO) Take by mouth. Stress Complex    ? SUMAtriptan (IMITREX) 100 MG tablet TAKE 1 TABLET DAILY AS     NEEDED. MAY REPEAT IN 2    HOURS IF NO BETTER 90 tablet 3  ? VITAMIN D PO Take by mouth.    ? ?No current facility-administered medications on file prior to visit.  ? ?No Known Allergies ?Social History  ? ?Socioeconomic History  ?  Marital status: Married  ?  Spouse name: Not on file  ? Number of children: Not on file  ? Years of education: Not on file  ? Highest education level: Not on file  ?Occupational History  ? Not on file  ?Tobacco Use  ? Smoking status: Never  ? Smokeless tobacco: Never  ?Substance and Sexual Activity  ? Alcohol use: No  ? Drug use: No  ? Sexual activity: Yes  ?Other  Topics Concern  ? Not on file  ?Social History Narrative  ? Not on file  ? ?Social Determinants of Health  ? ?Financial Resource Strain: Not on file  ?Food Insecurity: Not on file  ?Transportation Needs: Not on file  ?Physical Activity: Not on file  ?Stress: Not on file  ?Social Connections: Not on file  ?Intimate Partner Violence: Not on file  ? ? ? ? ?Review of Systems  ?All other systems reviewed and are negative. ? ?   ?Objective:  ? Physical Exam ?Vitals reviewed.  ?Constitutional:   ?   General: She is not in acute distress. ?   Appearance: Normal appearance. She is not ill-appearing or toxic-appearing.  ?Cardiovascular:  ?   Rate and Rhythm: Normal rate and regular rhythm.  ?   Heart sounds: Normal heart sounds. No murmur heard. ?  No friction rub. No gallop.  ?Neurological:  ?   Mental Status: She is alert.  ?Psychiatric:     ?   Attention and Perception: Attention and perception normal. She is attentive. She does not perceive auditory or visual hallucinations.     ?   Mood and Affect: Mood is anxious. Mood is not elated. Affect is tearful. Affect is not labile, blunt, flat, angry or inappropriate.     ?   Speech: Speech normal. She is communicative. Speech is not rapid and pressured, delayed or slurred.     ?   Behavior: Behavior normal.     ?   Thought Content: Thought content normal.     ?   Cognition and Memory: Cognition and memory normal.     ?   Judgment: Judgment normal. Judgment is not impulsive or inappropriate.  ? ? ? ? ? ?   ?Assessment & Plan:  ?Bipolar disorder, current episode depressed, severe, without psychotic features ([place]) -  Plan: Ambulatory referral to Psychiatry ? ?Pure hypercholesterolemia - Plan: CT CARDIAC SCORING (SELF PAY ONLY) ?I am glad that the Arman Filter is helping.  Add hydroxyzine 25 mg every 8 hours as needed for sle

## 2021-12-10 ENCOUNTER — Other Ambulatory Visit: Payer: Self-pay | Admitting: Family Medicine

## 2021-12-10 ENCOUNTER — Encounter: Payer: Self-pay | Admitting: Family Medicine

## 2021-12-10 ENCOUNTER — Ambulatory Visit (HOSPITAL_COMMUNITY)
Admission: RE | Admit: 2021-12-10 | Discharge: 2021-12-10 | Disposition: A | Discharge status: Home / Self Care | Payer: Self-pay | Source: Ambulatory Visit | Attending: Family Medicine | Admitting: Family Medicine

## 2021-12-10 DIAGNOSIS — E7800 Pure hypercholesterolemia, unspecified: Secondary | ICD-10-CM | POA: Insufficient documentation

## 2021-12-11 ENCOUNTER — Ambulatory Visit (HOSPITAL_COMMUNITY): Payer: BC Managed Care – PPO

## 2021-12-30 ENCOUNTER — Other Ambulatory Visit: Payer: Self-pay

## 2021-12-30 ENCOUNTER — Encounter: Payer: Self-pay | Admitting: Family Medicine

## 2021-12-30 MED ORDER — ESTRADIOL 0.1 MG/GM VA CREA: 0.5000 g | TOPICAL_CREAM | VAGINAL | 3 refills | Status: DC

## 2021-12-30 NOTE — Telephone Encounter (Signed)
Rx sent 

## 2022-01-09 ENCOUNTER — Encounter: Payer: Self-pay | Admitting: Family Medicine

## 2022-01-15 ENCOUNTER — Ambulatory Visit: Payer: BC Managed Care – PPO | Admitting: Family Medicine

## 2022-02-03 ENCOUNTER — Ambulatory Visit: Payer: BC Managed Care – PPO | Admitting: Family Medicine

## 2022-02-03 ENCOUNTER — Encounter: Payer: Self-pay | Admitting: Family Medicine

## 2022-02-03 VITALS — BP 122/72 | HR 83 | Temp 98.1°F | Ht 63.0 in | Wt 149.6 lb

## 2022-02-03 DIAGNOSIS — F314 Bipolar disorder, current episode depressed, severe, without psychotic features: Secondary | ICD-10-CM | POA: Diagnosis not present

## 2022-02-03 MED ORDER — CARIPRAZINE HCL 3 MG PO CAPS: 3.0000 mg | ORAL_CAPSULE | Freq: Every day | ORAL | 1 refills | Status: DC

## 2022-02-03 MED ORDER — LAMOTRIGINE 25 MG PO TABS: 50.0000 mg | ORAL_TABLET | Freq: Every day | ORAL | 0 refills | Status: DC

## 2022-02-03 NOTE — Progress Notes (Signed)
Subjective:    Patient ID: Kristine Ortega, female    DOB: 07-Oct-1965, 56 y.o.   MRN: [id number]  Anxiety   11/13/21 Patient has been seeing my partner who has since retired.  The other physician that she would see has recently left the practice.  Therefore she is here to establish care with me.  She has a history of bipolar 2 disorder.  She is currently on Lamictal 150 mg twice daily.  In the past she had severe issues with anxiety.  She questions if she is truly bipolar.  However, she did poorly on antidepressants in the past and only improved after she started taking mood stabilizers.  She has done well for several years however recently she feels that the anxiety is starting to build inside her again.  She states that she feels like she is a washing machine on a spin cycle.  She reports racing thoughts.  She reports that she is always anxious and worrying.  She states that she still sleeping.  However her mind does tend to race at night.  She denies any hallucinations.  She denies any delusions.  She denies any depression.  However she feels like she is shaking constantly on the outside.  She states feels like she is shaking constantly on the inside.  She always feels nervous and overwhelmed when she cannot breathe and is on the verge of a panic attack.  She does have a history of generalized anxiety disorder but she is afraid to take Xanax.  She seldom uses this.  Today there is no pressured speech or tangential speech.  She seems anxious and scared.  She states that she does not want to get as bad as she was before.  A few years ago, she feels like everything got out of control.  Bipolar does run in her family.  She states that her mother was diagnosed with bipolar.  At that time, my plan was:  I only have secondhand knowledge of her previous episode several years ago.  However at that time, the patient was manic based on my recollection of discussions that I had with her previous physician.  I do  feel that she would benefit from a better mood stabilizer, start the patient on Vraylar 1.5 mg daily.  I have encouraged her to use Xanax 0.5 mg as needed for panic.  I recommended that she not wait until the symptoms spiraled out of control and she has a full-blown panic attack before she uses the medication.  I would like to see her back in 2 weeks to see how she is doing or sooner if worsening.  My hope eventually is to wean her away from [place] and to maintain the patient on Vraylar.  11/28/21 Patient states that she is doing better on the Vraylar.  She states that the anxiety has improved.  She is still not sleeping well.  She is still dealing with anxiety but her symptoms have improved substantially.  She is interesting in pursuing disability.  She states that she just has a difficult time focusing and managing her anxiety to be able to work.  She has not followed up with her psychiatrist in years.  She states that she had a bad experience with her previous psychiatrist.  I recommended that she see a psychiatrist before pursuing disability just to see if they can better help manage her symptoms.  She also has a history of severe hyperlipidemia.  She refuses a statin.  She has a strong family history of coronary artery disease.  At that time, my plan was: I am glad that the Arman Filter is helping.  Add hydroxyzine 25 mg every 8 hours as needed for sleep or anxiety.  Consult psychiatry to assist in management.  Regarding her hyperlipidemia, I strongly recommend a statin based on the previous LDL cholesterol.  However we discussed that the patient would like to pursue a CT calcium score.  If the calcium score is elevated, patient will then reconsider taking a statin to reduce her risk of having MI, etc.   02/03/22 Patient met with a psychiatrist.  They performed an initial assessment and recommended cognitive behavioral therapy but patient politely declined that.  She is not interested in counseling at the  present time.  She is here today reporting that she is having a difficult time focusing.  She states that she has a list of things that she needs to do at home however she feels nervous and she hesitates to even begin them because she is afraid to get started.  She feels like her mind is unable to focus that she just cannot get back to her previous baseline.  She feels anxious.  At times she feels overwhelmed driving in traffic and being around crowds.  She is not having full-blown panic attacks but there is a generalized feeling of anxiety.  She denies racing thoughts or depression or suicidal thoughts.  At other times she feels foggy and clouded in her memory and her judgment. Past Medical History:  Diagnosis Date   Anxiety    Cancer ([place])    breast cancer   Complication of anesthesia    Depression    Headache(784.0)    Motion sickness    PONV (postoperative nausea and vomiting)    Past Surgical History:  Procedure Laterality Date   ABDOMINAL HYSTERECTOMY  2007   BSO   BREAST IMPLANT REMOVAL Bilateral 07/15/2017   Procedure: REMOVAL BREAST IMPLANTS;  Surgeon: Wallace Going, DO;  Location: [place];  Service: Plastics;  Laterality: Bilateral;   BREAST RECONSTRUCTION  2005   implants-bilat   CARPAL TUNNEL RELEASE Right 05/16/2013   Procedure: CARPAL TUNNEL RELEASE RIGHT ;  Surgeon: Cammie Sickle., MD;  Location: [place];  Service: Orthopedics;  Laterality: Right;   CARPAL TUNNEL RELEASE Left 08/08/2013   Procedure: LEFT CARPAL TUNNEL RELEASE;  Surgeon: Cammie Sickle., MD;  Location: [place];  Service: Orthopedics;  Laterality: Left;   CESAREAN SECTION     x4   MASTECTOMY W/ SENTINEL NODE BIOPSY  2005   left-   PLACEMENT OF BREAST IMPLANTS Bilateral 07/15/2017   Procedure: PLACEMENT OF BREAST IMPLANTS;  Surgeon: Wallace Going, DO;  Location: [place];  Service: Plastics;  Laterality: Bilateral;    PORT-A-CATH REMOVAL  2007   insertion then out   SIMPLE MASTECTOMY  2005   right-prophalactic   TONSILLECTOMY     Current Outpatient Medications on File Prior to Visit  Medication Sig Dispense Refill   ALPRAZolam (XANAX) 0.5 MG tablet Take 1 tablet (0.5 mg total) by mouth every 8 (eight) hours. 30 tablet 0   Ascorbic Acid (VITAMIN C PO) Take by mouth.     BIOTIN PO Take by mouth.     cariprazine (VRAYLAR) 1.5 MG capsule Take 1 capsule (1.5 mg total) by mouth daily. 30 capsule 3   estradiol (ESTRACE VAGINAL) 0.1 MG/GM vaginal cream Place 0.5  g vaginally 2 (two) times a week. 42.5 g 3   Hormone Cream Base (HRT BASE) CREA TESTOSTERONE HRT (10ML) 5% CREAM- APPLY A PEA-SIZE AMOUNT TO LABIA 5X WEEKLY 1000 g 3   hydrOXYzine (VISTARIL) 25 MG capsule Take 1 capsule (25 mg total) by mouth every 8 (eight) hours as needed for anxiety (sleep). 30 capsule 0   lamoTRIgine (LAMICTAL) 150 MG tablet TAKE 1 TABLET TWICE A DAY 180 tablet 1   MAGNESIUM-OXIDE PO Take by mouth.     MELATONIN PO Take by mouth.     Multiple Vitamins-Minerals (WOMENS DAILY FORMULA) TABS Take by mouth.     OVER THE COUNTER MEDICATION chamomile, valerian root, hops blend     Rhodiola rosea (RHODIOLA PO) Take by mouth. Stress Complex     SUMAtriptan (IMITREX) 100 MG tablet TAKE 1 TABLET DAILY AS     NEEDED. MAY REPEAT IN 2    HOURS IF NO BETTER 90 tablet 3   VITAMIN D PO Take by mouth.     No current facility-administered medications on file prior to visit.   No Known Allergies Social History   Socioeconomic History   Marital status: Married    Spouse name: Not on file   Number of children: Not on file   Years of education: Not on file   Highest education level: Not on file  Occupational History   Not on file  Tobacco Use   Smoking status: Never   Smokeless tobacco: Never  Substance and Sexual Activity   Alcohol use: No   Drug use: No   Sexual activity: Yes  Other Topics Concern   Not on file  Social History  Narrative   Not on file   Social Determinants of Health   Financial Resource Strain: Not on file  Food Insecurity: Not on file  Transportation Needs: Not on file  Physical Activity: Not on file  Stress: Not on file  Social Connections: Not on file  Intimate Partner Violence: Not on file      Review of Systems  All other systems reviewed and are negative.     Objective:   Physical Exam Vitals reviewed.  Constitutional:      General: She is not in acute distress.    Appearance: Normal appearance. She is not ill-appearing or toxic-appearing.  Cardiovascular:     Rate and Rhythm: Normal rate and regular rhythm.     Heart sounds: Normal heart sounds. No murmur heard.   No friction rub. No gallop.  Neurological:     Mental Status: She is alert.  Psychiatric:        Attention and Perception: Attention and perception normal. She is attentive. She does not perceive auditory or visual hallucinations.        Mood and Affect: Mood is not anxious or elated. Affect is not labile, blunt, flat, angry, tearful or inappropriate.        Speech: Speech normal. She is communicative. Speech is not rapid and pressured, delayed or slurred.        Behavior: Behavior normal.        Thought Content: Thought content normal.        Cognition and Memory: Cognition and memory normal.        Judgment: Judgment normal. Judgment is not impulsive or inappropriate.          Assessment & Plan:   Bipolar disorder, current episode depressed, severe, without psychotic features ([place]) I would like to increase the Vraylar to  3 mg because I feel the majority of this is dealing with depression given some of the anhedonia and poor concentration and lack of focus.  Also believe that polypharmacy may be playing a role in clouding her concentration so I would like to wean down on the Lamictal and officially discontinue it.  Decrease Lamictal to 50 mg twice a day for 1 week and then 50 mg once a day for 1 week and  then stop the medication.  Recheck the patient in 2 weeks to see how she is tolerating changes.  My hope is that the higher dose of Vraylar will more effectively manage her mood symptoms including her anxiety and depression with less side effects as the combination therapy

## 2022-02-17 ENCOUNTER — Encounter: Payer: Self-pay | Admitting: Family Medicine

## 2022-02-17 ENCOUNTER — Ambulatory Visit: Payer: BC Managed Care – PPO | Admitting: Family Medicine

## 2022-02-17 VITALS — BP 116/72 | HR 75 | Ht 63.0 in | Wt 145.0 lb

## 2022-02-17 DIAGNOSIS — F411 Generalized anxiety disorder: Secondary | ICD-10-CM | POA: Diagnosis not present

## 2022-02-17 MED ORDER — CLONAZEPAM 0.5 MG PO TABS: 0.5000 mg | ORAL_TABLET | Freq: Two times a day (BID) | ORAL | 1 refills | Status: DC

## 2022-02-17 NOTE — Progress Notes (Signed)
Subjective:    Patient ID: Kristine Ortega, female    DOB: Nov 05, 1965, 56 y.o.   MRN: [id number]  11/13/21 Patient has been seeing my partner who has since retired.  The other physician that she would see has recently left the practice.  Therefore she is here to establish care with me.  She has a history of bipolar 2 disorder.  She is currently on Lamictal 150 mg twice daily.  In the past she had severe issues with anxiety.  She questions if she is truly bipolar.  However, she did poorly on antidepressants in the past and only improved after she started taking mood stabilizers.  She has done well for several years however recently she feels that the anxiety is starting to build inside her again.  She states that she feels like she is a washing machine on a spin cycle.  She reports racing thoughts.  She reports that she is always anxious and worrying.  She states that she still sleeping.  However her mind does tend to race at night.  She denies any hallucinations.  She denies any delusions.  She denies any depression.  However she feels like she is shaking constantly on the outside.  She states feels like she is shaking constantly on the inside.  She always feels nervous and overwhelmed when she cannot breathe and is on the verge of a panic attack.  She does have a history of generalized anxiety disorder but she is afraid to take Xanax.  She seldom uses this.  Today there is no pressured speech or tangential speech.  She seems anxious and scared.  She states that she does not want to get as bad as she was before.  A few years ago, she feels like everything got out of control.  Bipolar does run in her family.  She states that her mother was diagnosed with bipolar.  At that time, my plan was:  I only have secondhand knowledge of her previous episode several years ago.  However at that time, the patient was manic based on my recollection of discussions that I had with her previous physician.  I do feel that she  would benefit from a better mood stabilizer, start the patient on Vraylar 1.5 mg daily.  I have encouraged her to use Xanax 0.5 mg as needed for panic.  I recommended that she not wait until the symptoms spiraled out of control and she has a full-blown panic attack before she uses the medication.  I would like to see her back in 2 weeks to see how she is doing or sooner if worsening.  My hope eventually is to wean her away from [place] and to maintain the patient on Vraylar.  11/28/21 Patient states that she is doing better on the Vraylar.  She states that the anxiety has improved.  She is still not sleeping well.  She is still dealing with anxiety but her symptoms have improved substantially.  She is interesting in pursuing disability.  She states that she just has a difficult time focusing and managing her anxiety to be able to work.  She has not followed up with her psychiatrist in years.  She states that she had a bad experience with her previous psychiatrist.  I recommended that she see a psychiatrist before pursuing disability just to see if they can better help manage her symptoms.  She also has a history of severe hyperlipidemia.  She refuses a statin.  She has a  strong family history of coronary artery disease.  At that time, my plan was: I am glad that the Arman Filter is helping.  Add hydroxyzine 25 mg every 8 hours as needed for sleep or anxiety.  Consult psychiatry to assist in management.  Regarding her hyperlipidemia, I strongly recommend a statin based on the previous LDL cholesterol.  However we discussed that the patient would like to pursue a CT calcium score.  If the calcium score is elevated, patient will then reconsider taking a statin to reduce her risk of having MI, etc.   02/03/22 Patient met with a psychiatrist.  They performed an initial assessment and recommended cognitive behavioral therapy but patient politely declined that.  She is not interested in counseling at the present time.   She is here today reporting that she is having a difficult time focusing.  She states that she has a list of things that she needs to do at home however she feels nervous and she hesitates to even begin them because she is afraid to get started.  She feels like her mind is unable to focus that she just cannot get back to her previous baseline.  She feels anxious.  At times she feels overwhelmed driving in traffic and being around crowds.  She is not having full-blown panic attacks but there is a generalized feeling of anxiety.  She denies racing thoughts or depression or suicidal thoughts.  At other times she feels foggy and clouded in her memory and her judgment.  At that time, my plan was:  I would like to increase the Vraylar to 3 mg because I feel the majority of this is dealing with depression given some of the anhedonia and poor concentration and lack of focus.  Also believe that polypharmacy may be playing a role in clouding her concentration so I would like to wean down on the Lamictal and officially discontinue it.  Decrease Lamictal to 50 mg twice a day for 1 week and then 50 mg once a day for 1 week and then stop the medication.  Recheck the patient in 2 weeks to see how she is tolerating changes.  My hope is that the higher dose of Vraylar will more effectively manage her mood symptoms including her anxiety and depression with less side effects as the combination therapy.  02/17/22 Patient states that ever since we started the Vraylar, she feels like she has become depressed.  She reports poor concentration.  She reports lack of energy.  She reports anhedonia.  She reports symptoms of depression.  She states that she does not want to spend time with her grandkids.  She does not want to exercise.  She has decreased libido.  She has failure to climax.  She feels like the room increase the Vraylar, the symptoms are all intensified.  Therefore she independently went back down to 1.5 mg.  On the lower  dose that she feels slightly better.  She states that when she first came to me in March, she was primarily dealing with severe anxiety.  She feels like that was causing racing thoughts not mania.  She feels like anxiety has only worsened and now she is dealing with depression.  She denies any impulsivity.  She does not appear manic.  She does not have tangential speech or pressured speech or psychomotor agitation.  She is very anxious. Past Medical History:  Diagnosis Date   Anxiety    Cancer [facility])    breast cancer   Complication  of anesthesia    Depression    Headache(784.0)    Motion sickness    PONV (postoperative nausea and vomiting)    Past Surgical History:  Procedure Laterality Date   ABDOMINAL HYSTERECTOMY  2007   BSO   BREAST IMPLANT REMOVAL Bilateral 07/15/2017   Procedure: REMOVAL BREAST IMPLANTS;  Surgeon: Wallace Going, DO;  Location: [place];  Service: Plastics;  Laterality: Bilateral;   BREAST RECONSTRUCTION  2005   implants-bilat   CARPAL TUNNEL RELEASE Right 05/16/2013   Procedure: CARPAL TUNNEL RELEASE RIGHT ;  Surgeon: Cammie Sickle., MD;  Location: [place];  Service: Orthopedics;  Laterality: Right;   CARPAL TUNNEL RELEASE Left 08/08/2013   Procedure: LEFT CARPAL TUNNEL RELEASE;  Surgeon: Cammie Sickle., MD;  Location: [place];  Service: Orthopedics;  Laterality: Left;   CESAREAN SECTION     x4   MASTECTOMY W/ SENTINEL NODE BIOPSY  2005   left-   PLACEMENT OF BREAST IMPLANTS Bilateral 07/15/2017   Procedure: PLACEMENT OF BREAST IMPLANTS;  Surgeon: Wallace Going, DO;  Location: [place];  Service: Plastics;  Laterality: Bilateral;   PORT-A-CATH REMOVAL  2007   insertion then out   SIMPLE MASTECTOMY  2005   right-prophalactic   TONSILLECTOMY     Current Outpatient Medications on File Prior to Visit  Medication Sig Dispense Refill   Ascorbic Acid (VITAMIN C PO) Take by  mouth.     cariprazine (VRAYLAR) 1.5 MG capsule Take 1 capsule (1.5 mg total) by mouth daily. 30 capsule 3   cariprazine (VRAYLAR) 3 MG capsule Take 1 capsule (3 mg total) by mouth daily. 30 capsule 1   estradiol (ESTRACE VAGINAL) 0.1 MG/GM vaginal cream Place 0.5 g vaginally 2 (two) times a week. 42.5 g 3   lamoTRIgine (LAMICTAL) 25 MG tablet Take 2 tablets (50 mg total) by mouth daily. 120 tablet 0   MAGNESIUM-OXIDE PO Take by mouth.     Multiple Vitamins-Minerals (WOMENS DAILY FORMULA) TABS Take by mouth.     Rhodiola rosea (RHODIOLA PO) Take by mouth. Stress Complex     SUMAtriptan (IMITREX) 100 MG tablet TAKE 1 TABLET DAILY AS     NEEDED. MAY REPEAT IN 2    HOURS IF NO BETTER 90 tablet 3   ALPRAZolam (XANAX) 0.5 MG tablet Take 1 tablet (0.5 mg total) by mouth every 8 (eight) hours. (Patient not taking: Reported on 02/17/2022) 30 tablet 0   BIOTIN PO Take by mouth. (Patient not taking: Reported on 02/17/2022)     Hormone Cream Base (HRT BASE) CREA TESTOSTERONE HRT (10ML) 5% CREAM- APPLY A PEA-SIZE AMOUNT TO LABIA 5X WEEKLY (Patient not taking: Reported on 02/17/2022) 1000 g 3   hydrOXYzine (VISTARIL) 25 MG capsule Take 1 capsule (25 mg total) by mouth every 8 (eight) hours as needed for anxiety (sleep). (Patient not taking: Reported on 02/03/2022) 30 capsule 0   MELATONIN PO Take by mouth. (Patient not taking: Reported on 02/17/2022)     OVER THE COUNTER MEDICATION chamomile, valerian root, hops blend (Patient not taking: Reported on 02/17/2022)     VITAMIN D PO Take by mouth. (Patient not taking: Reported on 02/17/2022)     No current facility-administered medications on file prior to visit.   No Known Allergies Social History   Socioeconomic History   Marital status: Married    Spouse name: Not on file   Number of children: Not on file  Years of education: Not on file   Highest education level: Not on file  Occupational History   Not on file  Tobacco Use   Smoking status: Never    Smokeless tobacco: Never  Substance and Sexual Activity   Alcohol use: No   Drug use: No   Sexual activity: Yes  Other Topics Concern   Not on file  Social History Narrative   Not on file   Social Determinants of Health   Financial Resource Strain: Not on file  Food Insecurity: Not on file  Transportation Needs: Not on file  Physical Activity: Not on file  Stress: Not on file  Social Connections: Not on file  Intimate Partner Violence: Not on file      Review of Systems  All other systems reviewed and are negative.      Objective:   Physical Exam Vitals reviewed.  Constitutional:      General: She is not in acute distress.    Appearance: Normal appearance. She is not ill-appearing or toxic-appearing.  Cardiovascular:     Rate and Rhythm: Normal rate and regular rhythm.     Heart sounds: Normal heart sounds. No murmur heard.    No friction rub. No gallop.  Neurological:     Mental Status: She is alert.  Psychiatric:        Attention and Perception: Attention and perception normal. She is attentive. She does not perceive auditory or visual hallucinations.        Mood and Affect: Mood is anxious and depressed. Mood is not elated. Affect is not labile, blunt, flat, angry, tearful or inappropriate.        Speech: Speech normal. She is communicative. Speech is not rapid and pressured, delayed or slurred.        Behavior: Behavior normal.        Thought Content: Thought content normal.        Cognition and Memory: Cognition and memory normal.        Judgment: Judgment normal. Judgment is not impulsive or inappropriate.           Assessment & Plan:  GAD (generalized anxiety disorder) I explained the patient that I feel the Arman Filter is not helping her.  It seems like her symptoms with some since we instituted the medication in general.  Therefore of asked her to stop the medication altogether.  I want her to resume the Lamictal at its previous dose.  She seems to have  actually been doing better on the Lamictal independently.  However I will start Klonopin 0.5 mg twice daily for anxiety and I will consult psychiatry.  Recheck in 2 weeks.

## 2022-02-19 ENCOUNTER — Encounter: Payer: Self-pay | Admitting: Family Medicine

## 2022-02-21 ENCOUNTER — Other Ambulatory Visit: Payer: Self-pay | Admitting: Family Medicine

## 2022-02-21 DIAGNOSIS — F3181 Bipolar II disorder: Secondary | ICD-10-CM

## 2022-02-23 NOTE — Telephone Encounter (Signed)
D/C 02/03/22.

## 2022-03-02 ENCOUNTER — Ambulatory Visit: Payer: BC Managed Care – PPO | Admitting: Family Medicine

## 2022-03-02 VITALS — BP 116/92 | HR 76 | Temp 97.9°F | Ht 63.0 in | Wt 143.0 lb

## 2022-03-02 DIAGNOSIS — F332 Major depressive disorder, recurrent severe without psychotic features: Secondary | ICD-10-CM

## 2022-03-02 MED ORDER — VENLAFAXINE HCL ER 75 MG PO CP24: 150.0000 mg | ORAL_CAPSULE | Freq: Every day | ORAL | 3 refills | Status: DC

## 2022-03-08 ENCOUNTER — Encounter: Payer: Self-pay | Admitting: Family Medicine

## 2022-03-09 MED ORDER — LAMOTRIGINE 25 MG PO TABS: 50.0000 mg | ORAL_TABLET | Freq: Every day | ORAL | 0 refills | Status: DC

## 2022-03-09 NOTE — Telephone Encounter (Signed)
It was denied the first time, not sure why. However, I did not refill it, wanted to check with you if you had denied it for some reason.  Please advice?

## 2022-03-12 ENCOUNTER — Other Ambulatory Visit: Payer: Self-pay | Admitting: Family Medicine

## 2022-03-12 MED ORDER — LAMOTRIGINE 150 MG PO TABS: 150.0000 mg | ORAL_TABLET | Freq: Every day | ORAL | 3 refills | Status: DC

## 2022-03-16 ENCOUNTER — Ambulatory Visit: Payer: BC Managed Care – PPO | Admitting: Family Medicine

## 2022-03-19 ENCOUNTER — Ambulatory Visit: Payer: BC Managed Care – PPO | Admitting: Family Medicine

## 2022-03-19 VITALS — BP 120/90 | HR 93 | Temp 98.8°F | Ht 63.0 in | Wt 139.0 lb

## 2022-03-19 DIAGNOSIS — F332 Major depressive disorder, recurrent severe without psychotic features: Secondary | ICD-10-CM

## 2022-03-19 MED ORDER — SERTRALINE HCL 100 MG PO TABS: 100.0000 mg | ORAL_TABLET | Freq: Every day | ORAL | 3 refills | Status: DC

## 2022-03-19 NOTE — Progress Notes (Signed)
Subjective:    Patient ID: Kristine Ortega, female    DOB: Nov 05, 1965, 56 y.o.   MRN: [id number]  11/13/21 Patient has been seeing my partner who has since retired.  The other physician that Kristine Ortega would see has recently left the practice.  Therefore Kristine Ortega is here to establish care with me.  Kristine Ortega has a history of bipolar 2 disorder.  Kristine Ortega is currently on Lamictal 150 mg twice daily.  In the past Kristine Ortega had severe issues with anxiety.  Kristine Ortega questions if Kristine Ortega is truly bipolar.  However, Kristine Ortega did poorly on antidepressants in the past and only improved after Kristine Ortega started taking mood stabilizers.  Kristine Ortega has done well for several years however recently Kristine Ortega feels that the anxiety is starting to build inside her again.  Kristine Ortega states that Kristine Ortega feels like Kristine Ortega is a washing machine on a spin cycle.  Kristine Ortega reports racing thoughts.  Kristine Ortega reports that Kristine Ortega is always anxious and worrying.  Kristine Ortega states that Kristine Ortega still sleeping.  However her mind does tend to race at night.  Kristine Ortega denies any hallucinations.  Kristine Ortega denies any delusions.  Kristine Ortega denies any depression.  However Kristine Ortega feels like Kristine Ortega is shaking constantly on the outside.  Kristine Ortega states feels like Kristine Ortega is shaking constantly on the inside.  Kristine Ortega always feels nervous and overwhelmed when Kristine Ortega cannot breathe and is on the verge of a panic attack.  Kristine Ortega does have a history of generalized anxiety disorder but Kristine Ortega is afraid to take Xanax.  Kristine Ortega seldom uses this.  Today there is no pressured speech or tangential speech.  Kristine Ortega seems anxious and scared.  Kristine Ortega states that Kristine Ortega does not want to get as bad as Kristine Ortega was before.  A few years ago, Kristine Ortega feels like everything got out of control.  Bipolar does run in her family.  Kristine Ortega states that her mother was diagnosed with bipolar.  At that time, my plan was:  I only have secondhand knowledge of her previous episode several years ago.  However at that time, the patient was manic based on my recollection of discussions that I had with her previous physician.  I do feel that Kristine Ortega  would benefit from a better mood stabilizer, start the patient on Vraylar 1.5 mg daily.  I have encouraged her to use Xanax 0.5 mg as needed for panic.  I recommended that Kristine Ortega not wait until the symptoms spiraled out of control and Kristine Ortega has a full-blown panic attack before Kristine Ortega uses the medication.  I would like to see her back in 2 weeks to see how Kristine Ortega is doing or sooner if worsening.  My hope eventually is to wean her away from Kristine Ortega and to maintain the patient on Vraylar.  11/28/21 Patient states that Kristine Ortega is doing better on the Vraylar.  Kristine Ortega states that the anxiety has improved.  Kristine Ortega is still not sleeping well.  Kristine Ortega is still dealing with anxiety but her symptoms have improved substantially.  Kristine Ortega is interesting in pursuing disability.  Kristine Ortega states that Kristine Ortega just has a difficult time focusing and managing her anxiety to be able to work.  Kristine Ortega has not followed up with her psychiatrist in years.  Kristine Ortega states that Kristine Ortega had a bad experience with her previous psychiatrist.  I recommended that Kristine Ortega see a psychiatrist before pursuing disability just to see if they can better help manage her symptoms.  Kristine Ortega also has a history of severe hyperlipidemia.  Kristine Ortega refuses a statin.  Kristine Ortega has a  strong family history of coronary artery disease.  At that time, my plan was: I am glad that the Arman Filter is helping.  Add hydroxyzine 25 mg every 8 hours as needed for sleep or anxiety.  Consult psychiatry to assist in management.  Regarding her hyperlipidemia, I strongly recommend a statin based on the previous LDL cholesterol.  However we discussed that the patient would like to pursue a CT calcium score.  If the calcium score is elevated, patient will then reconsider taking a statin to reduce her risk of having MI, etc.   02/03/22 Patient met with a psychiatrist.  They performed an initial assessment and recommended cognitive behavioral therapy but patient politely declined that.  Kristine Ortega is not interested in counseling at the present time.   Kristine Ortega is here today reporting that Kristine Ortega is having a difficult time focusing.  Kristine Ortega states that Kristine Ortega has a list of things that Kristine Ortega needs to do at home however Kristine Ortega feels nervous and Kristine Ortega hesitates to even begin them because Kristine Ortega is afraid to get started.  Kristine Ortega feels like her mind is unable to focus that Kristine Ortega just cannot get back to her previous baseline.  Kristine Ortega feels anxious.  At times Kristine Ortega feels overwhelmed driving in traffic and being around crowds.  Kristine Ortega is not having full-blown panic attacks but there is a generalized feeling of anxiety.  Kristine Ortega denies racing thoughts or depression or suicidal thoughts.  At other times Kristine Ortega feels foggy and clouded in her memory and her judgment.  At that time, my plan was:  I would like to increase the Vraylar to 3 mg because I feel the majority of this is dealing with depression given some of the anhedonia and poor concentration and lack of focus.  Also believe that polypharmacy may be playing a role in clouding her concentration so I would like to wean down on the Lamictal and officially discontinue it.  Decrease Lamictal to 50 mg twice a day for 1 week and then 50 mg once a day for 1 week and then stop the medication.  Recheck the patient in 2 weeks to see how Kristine Ortega is tolerating changes.  My hope is that the higher dose of Vraylar will more effectively manage her mood symptoms including her anxiety and depression with less side effects as the combination therapy.  02/17/22 Patient states that ever since we started the Vraylar, Kristine Ortega feels like Kristine Ortega has become depressed.  Kristine Ortega reports poor concentration.  Kristine Ortega reports lack of energy.  Kristine Ortega reports anhedonia.  Kristine Ortega reports symptoms of depression.  Kristine Ortega states that Kristine Ortega does not want to spend time with her grandkids.  Kristine Ortega does not want to exercise.  Kristine Ortega has decreased libido.  Kristine Ortega has failure to climax.  Kristine Ortega feels like the room increase the Vraylar, the symptoms are all intensified.  Therefore Kristine Ortega independently went back down to 1.5 mg.  On the lower  dose that Kristine Ortega feels slightly better.  Kristine Ortega states that when Kristine Ortega first came to me in March, Kristine Ortega was primarily dealing with severe anxiety.  Kristine Ortega feels like that was causing racing thoughts not mania.  Kristine Ortega feels like anxiety has only worsened and now Kristine Ortega is dealing with depression.  Kristine Ortega denies any impulsivity.  Kristine Ortega does not appear manic.  Kristine Ortega does not have tangential speech or pressured speech or psychomotor agitation.  Kristine Ortega is very anxious.  At that time, my plan was:  I explained to the patient that I feel the Arman Filter is not helping her.  It seems like her symptoms have worsened since we instituted the medication in general.  Therefore of asked her to stop the medication altogether.  I want her to resume the Lamictal at its previous dose.  Kristine Ortega seems to have been doing better on the Lamictal independently.  However I will start Klonopin 0.5 mg twice daily for anxiety and I will consult psychiatry.  Recheck in 2 weeks.  03/02/22 Patient is doing much worse today.  Kristine Ortega is tearful on encounter.  Kristine Ortega is extremely depressed.  Kristine Ortega has a fear that Kristine Ortega is going back and severe depression that hospitalized her in the past.  Kristine Ortega seems to be doing better from an anxiety standpoint on the Klonopin.  Kristine Ortega denies any suicidal ideation.  However Kristine Ortega reports lack of energy, poor focus.  Kristine Ortega is not reporting any issues with insomnia.  Kristine Ortega denies any hallucinations or delusions.  Kristine Ortega has an appointment to see the psychiatrist in mid July.  At that time, my plan was:  Patient seems to be dealing with severe depression.  Begin Effexor XR 75 mg p.o. every morning.  Increase to 150 mg in 1 week.  Provided the patient contact information for counseling and encouraged her to meet with a counselor.  Also discussed positive thinking, emphasized the importance of exercise and staying active and associating with people that Kristine Ortega feels comfortable around to try to stay active and involved and to avoid isolation.  Reassess in 4  weeks.  03/19/22 Wt Readings from Last 3 Encounters:  03/19/22 139 lb (63 kg)  03/02/22 143 lb (64.9 kg)  02/17/22 145 lb (65.8 kg)   Patient's depression is worsening.  Kristine Ortega is only been on the Effexor for 2 weeks.  The medication has not had sufficient time to work but Kristine Ortega feels like her depression is getting out of control.  Today Kristine Ortega is tearful.  Her right arm is trembling due to anxiety.  Kristine Ortega states that Kristine Ortega is trying to stay active.  When Kristine Ortega stays at home alone, Kristine Ortega feels like Kristine Ortega starts to perseverate over thoughts that her mind is progressing back to her previous state which scares her.  Sometimes Kristine Ortega will have 3-4 episodes a week where Kristine Ortega will suddenly start crying uncontrollably for no reason.  This can last hours.  Kristine Ortega has met with a psychologist.  Kristine Ortega has an appointment to see the psychiatrist in 3 weeks however Kristine Ortega would like to switch from Effexor to Zoloft because her mother performed better on Zoloft.  Psychiatry recommended "genesight" testing and we can perform this here in the office.  I explained to the patient that insurance likely does not cover it and there is not FDA approval for his testing that Kristine Ortega would like to proceed to try to help determine what medication will be most effective for her. Past Medical History:  Diagnosis Date   Anxiety    Cancer ([facility])    breast cancer   Complication of anesthesia    Depression    Headache(784.0)    Motion sickness    PONV (postoperative nausea and vomiting)    Past Surgical History:  Procedure Laterality Date   ABDOMINAL HYSTERECTOMY  2007   BSO   BREAST IMPLANT REMOVAL Bilateral 07/15/2017   Procedure: REMOVAL BREAST IMPLANTS;  Surgeon: Wallace Going, DO;  Location: [facility];  Service: Plastics;  Laterality: Bilateral;   BREAST RECONSTRUCTION  2005   implants-bilat   CARPAL TUNNEL RELEASE Right 05/16/2013   Procedure: CARPAL  TUNNEL RELEASE RIGHT ;  Surgeon: Cammie Sickle., MD;  Location: [facility];  Service: Orthopedics;  Laterality: Right;   CARPAL TUNNEL RELEASE Left 08/08/2013   Procedure: LEFT CARPAL TUNNEL RELEASE;  Surgeon: Cammie Sickle., MD;  Location: [facility];  Service: Orthopedics;  Laterality: Left;   CESAREAN SECTION     x4   MASTECTOMY W/ SENTINEL NODE BIOPSY  2005   left-   PLACEMENT OF BREAST IMPLANTS Bilateral 07/15/2017   Procedure: PLACEMENT OF BREAST IMPLANTS;  Surgeon: Wallace Going, DO;  Location: [facility];  Service: Plastics;  Laterality: Bilateral;   PORT-A-CATH REMOVAL  2007   insertion then out   SIMPLE MASTECTOMY  2005   right-prophalactic   TONSILLECTOMY     Current Outpatient Medications on File Prior to Visit  Medication Sig Dispense Refill   ALPRAZolam (XANAX) 0.5 MG tablet Take 1 tablet (0.5 mg total) by mouth every 8 (eight) hours. 30 tablet 0   Ascorbic Acid (VITAMIN C PO) Take by mouth.     BIOTIN PO Take by mouth.     cariprazine (VRAYLAR) 1.5 MG capsule Take 1 capsule (1.5 mg total) by mouth daily. 30 capsule 3   cariprazine (VRAYLAR) 3 MG capsule Take 1 capsule (3 mg total) by mouth daily. 30 capsule 1   clonazePAM (KLONOPIN) 0.5 MG tablet Take 1 tablet (0.5 mg total) by mouth 2 (two) times daily. 60 tablet 1   estradiol (ESTRACE VAGINAL) 0.1 MG/GM vaginal cream Place 0.5 g vaginally 2 (two) times a week. 42.5 g 3   Hormone Cream Base (HRT BASE) CREA TESTOSTERONE HRT (10ML) 5% CREAM- APPLY A PEA-SIZE AMOUNT TO LABIA 5X WEEKLY 1000 g 3   hydrOXYzine (VISTARIL) 25 MG capsule Take 1 capsule (25 mg total) by mouth every 8 (eight) hours as needed for anxiety (sleep). 30 capsule 0   lamoTRIgine (LAMICTAL) 150 MG tablet Take 1 tablet (150 mg total) by mouth daily. 90 tablet 3   MAGNESIUM-OXIDE PO Take by mouth.     MELATONIN PO Take by mouth.     Multiple Vitamins-Minerals (WOMENS DAILY FORMULA) TABS Take by mouth.     OVER THE COUNTER MEDICATION chamomile, valerian root, hops blend      Rhodiola rosea (RHODIOLA PO) Take by mouth. Stress Complex     SUMAtriptan (IMITREX) 100 MG tablet TAKE 1 TABLET DAILY AS     NEEDED. MAY REPEAT IN 2    HOURS IF NO BETTER 90 tablet 3   venlafaxine XR (EFFEXOR XR) 75 MG 24 hr capsule Take 2 capsules (150 mg total) by mouth daily with breakfast. 60 capsule 3   VITAMIN D PO Take by mouth.     No current facility-administered medications on file prior to visit.   No Known Allergies Social History   Socioeconomic History   Marital status: Married    Spouse name: Not on file   Number of children: Not on file   Years of education: Not on file   Highest education level: Not on file  Occupational History   Not on file  Tobacco Use   Smoking status: Never   Smokeless tobacco: Never  Substance and Sexual Activity   Alcohol use: No   Drug use: No   Sexual activity: Yes  Other Topics Concern   Not on file  Social History Narrative   Not on file   Social Determinants of Health   Financial Resource Strain: Not on  file  Food Insecurity: Not on file  Transportation Needs: Not on file  Physical Activity: Not on file  Stress: Not on file  Social Connections: Not on file  Intimate Partner Violence: Not on file      Review of Systems  All other systems reviewed and are negative.      Objective:   Physical Exam Vitals reviewed.  Constitutional:      General: Kristine Ortega is not in acute distress.    Appearance: Normal appearance. Kristine Ortega is not ill-appearing or toxic-appearing.  Cardiovascular:     Rate and Rhythm: Normal rate and regular rhythm.     Heart sounds: Normal heart sounds. No murmur heard.    No friction rub. No gallop.  Neurological:     Mental Status: Kristine Ortega is alert.  Psychiatric:        Attention and Perception: Attention and perception normal. Kristine Ortega is attentive. Kristine Ortega does not perceive auditory or visual hallucinations.        Mood and Affect: Mood is anxious and depressed. Mood is not elated. Affect is not labile, blunt, flat,  angry, tearful or inappropriate.        Speech: Speech normal. Kristine Ortega is communicative. Speech is not rapid and pressured, delayed or slurred.        Behavior: Behavior normal.        Thought Content: Thought content normal.        Cognition and Memory: Cognition and memory normal.        Judgment: Judgment normal. Judgment is not impulsive or inappropriate.           Assessment & Plan:  Severe episode of recurrent major depressive disorder, without psychotic features (HCC) Discontinue Effexor.  Replace with Zoloft 100 mg a day.  Increase Lamictal to 150 twice a day.  Recheck in 4 to 6 weeks.  I will order genesight testing but I explained to the patient that this will likely be not covered by her insurance.

## 2022-03-22 ENCOUNTER — Encounter: Payer: Self-pay | Admitting: Family Medicine

## 2022-03-24 ENCOUNTER — Other Ambulatory Visit: Payer: Self-pay | Admitting: Family Medicine

## 2022-03-24 NOTE — Telephone Encounter (Signed)
LOV 03/19/22 Last refill 02/17/22, #30, 1 refills  Please review, thanks!

## 2022-03-31 ENCOUNTER — Ambulatory Visit: Payer: BC Managed Care – PPO | Admitting: Family Medicine

## 2022-04-20 ENCOUNTER — Ambulatory Visit: Payer: BC Managed Care – PPO | Admitting: Family Medicine

## 2022-04-27 ENCOUNTER — Ambulatory Visit: Payer: BC Managed Care – PPO | Admitting: Family Medicine

## 2022-05-28 ENCOUNTER — Telehealth: Payer: Self-pay

## 2022-05-28 ENCOUNTER — Encounter: Payer: Self-pay | Admitting: Family Medicine

## 2022-05-28 NOTE — Telephone Encounter (Signed)
Message from patient: Dr Dennard Schaumann sent me to [facility] and the provider there wants to get some blood work...TSH, Vit D, Vit B12, CBC,CMP,Lipid panel and hormonal labs. Can you do all of these at your office or does he need to write an order for me to go elsewhere?  I would also like a refill on .5 Xanax.   Last RF 12/18/2020. Last OV 03/19/2022. Thank you.

## 2022-05-29 ENCOUNTER — Other Ambulatory Visit: Payer: Self-pay | Admitting: Family Medicine

## 2022-05-29 DIAGNOSIS — F411 Generalized anxiety disorder: Secondary | ICD-10-CM

## 2022-05-29 MED ORDER — ALPRAZOLAM 0.5 MG PO TABS
0.5000 mg | ORAL_TABLET | Freq: Three times a day (TID) | ORAL | 0 refills | Status: DC
Start: 2022-05-29 — End: 2024-03-15

## 2022-06-02 ENCOUNTER — Encounter: Payer: Self-pay | Admitting: Family Medicine

## 2022-06-02 NOTE — Telephone Encounter (Signed)
Patient called back to advise she needs a hormonal panel. Patient stated she will speak with her ob/gyn provider to have her list out the specific tests and will route a message to [place] via MyChart.

## 2022-06-04 ENCOUNTER — Other Ambulatory Visit: Payer: BC Managed Care – PPO

## 2022-06-04 DIAGNOSIS — R5383 Other fatigue: Secondary | ICD-10-CM

## 2022-06-04 DIAGNOSIS — N951 Menopausal and female climacteric states: Secondary | ICD-10-CM

## 2022-06-04 DIAGNOSIS — E7800 Pure hypercholesterolemia, unspecified: Secondary | ICD-10-CM

## 2022-06-04 DIAGNOSIS — Z780 Asymptomatic menopausal state: Secondary | ICD-10-CM

## 2022-06-09 ENCOUNTER — Encounter: Payer: Self-pay | Admitting: Family Medicine

## 2022-06-10 LAB — COMPREHENSIVE METABOLIC PANEL
AG Ratio: 1.8 (calc) (ref 1.0–2.5)
ALT: 10 U/L (ref 6–29)
AST: 13 U/L (ref 10–35)
Albumin: 4.5 g/dL (ref 3.6–5.1)
Alkaline phosphatase (APISO): 59 U/L (ref 37–153)
BUN: 14 mg/dL (ref 7–25)
CO2: 30 mmol/L (ref 20–32)
Calcium: 9.9 mg/dL (ref 8.6–10.4)
Chloride: 104 mmol/L (ref 98–110)
Creat: 0.89 mg/dL (ref 0.50–1.03)
Globulin: 2.5 g/dL (calc) (ref 1.9–3.7)
Glucose, Bld: 87 mg/dL (ref 65–99)
Potassium: 4.9 mmol/L (ref 3.5–5.3)
Sodium: 142 mmol/L (ref 135–146)
Total Bilirubin: 0.4 mg/dL (ref 0.2–1.2)
Total Protein: 7 g/dL (ref 6.1–8.1)

## 2022-06-10 LAB — VITAMIN D 25 HYDROXY (VIT D DEFICIENCY, FRACTURES): Vit D, 25-Hydroxy: 92 ng/mL (ref 30–100)

## 2022-06-10 LAB — CBC WITH DIFFERENTIAL/PLATELET
Absolute Monocytes: 331 cells/uL (ref 200–950)
Basophils Absolute: 32 cells/uL (ref 0–200)
Basophils Relative: 0.7 %
Eosinophils Absolute: 0 cells/uL — ABNORMAL LOW (ref 15–500)
Eosinophils Relative: 0 %
HCT: 42.5 % (ref 35.0–45.0)
Hemoglobin: 14.1 g/dL (ref 11.7–15.5)
Lymphs Abs: 1311 cells/uL (ref 850–3900)
MCH: 28.5 pg (ref 27.0–33.0)
MCHC: 33.2 g/dL (ref 32.0–36.0)
MCV: 85.9 fL (ref 80.0–100.0)
MPV: 10.4 fL (ref 7.5–12.5)
Monocytes Relative: 7.2 %
Neutro Abs: 2926 cells/uL (ref 1500–7800)
Neutrophils Relative %: 63.6 %
Platelets: 285 10*3/uL (ref 140–400)
RBC: 4.95 10*6/uL (ref 3.80–5.10)
RDW: 12.5 % (ref 11.0–15.0)
Total Lymphocyte: 28.5 %
WBC: 4.6 10*3/uL (ref 3.8–10.8)

## 2022-06-10 LAB — TESTOSTERONE: Testosterone: 18 ng/dL

## 2022-06-10 LAB — DHEA: DHEA: 239 ng/dL

## 2022-06-10 LAB — PROLACTIN: Prolactin: 6.2 ng/mL

## 2022-06-10 LAB — LIPID PANEL
Cholesterol: 275 mg/dL — ABNORMAL HIGH (ref <200)
HDL: 49 mg/dL — ABNORMAL LOW (ref >=50)
LDL Cholesterol (Calc): 193 mg/dL (calc) — ABNORMAL HIGH
Non-HDL Cholesterol (Calc): 226 mg/dL (calc) — ABNORMAL HIGH (ref <130)
Total CHOL/HDL Ratio: 5.6 (calc) — ABNORMAL HIGH (ref <5.0)
Triglycerides: 160 mg/dL — ABNORMAL HIGH (ref <150)

## 2022-06-10 LAB — TSH: TSH: 0.66 mIU/L (ref 0.40–4.50)

## 2022-07-07 ENCOUNTER — Encounter: Payer: Self-pay | Admitting: Family Medicine

## 2022-07-20 ENCOUNTER — Ambulatory Visit (INDEPENDENT_AMBULATORY_CARE_PROVIDER_SITE_OTHER): Payer: BC Managed Care – PPO

## 2022-07-20 DIAGNOSIS — Z23 Encounter for immunization: Secondary | ICD-10-CM

## 2022-07-27 ENCOUNTER — Encounter: Payer: Self-pay | Admitting: Family Medicine

## 2022-09-01 ENCOUNTER — Encounter: Payer: Self-pay | Admitting: Family Medicine

## 2022-09-03 ENCOUNTER — Other Ambulatory Visit: Payer: Self-pay | Admitting: Family Medicine

## 2022-11-05 ENCOUNTER — Encounter: Payer: Self-pay | Admitting: Radiology

## 2023-01-12 ENCOUNTER — Encounter: Payer: Self-pay | Admitting: Family Medicine

## 2023-01-19 ENCOUNTER — Encounter: Payer: Self-pay | Admitting: Family Medicine

## 2023-02-02 ENCOUNTER — Encounter: Payer: Self-pay | Admitting: Family Medicine

## 2023-02-02 ENCOUNTER — Ambulatory Visit: Payer: BC Managed Care – PPO | Admitting: Family Medicine

## 2023-02-02 ENCOUNTER — Other Ambulatory Visit: Payer: Self-pay | Admitting: Family Medicine

## 2023-02-02 VITALS — BP 114/62 | HR 76 | Temp 98.7°F | Ht 63.0 in | Wt 159.2 lb

## 2023-02-02 DIAGNOSIS — Z780 Asymptomatic menopausal state: Secondary | ICD-10-CM | POA: Diagnosis not present

## 2023-02-02 DIAGNOSIS — R635 Abnormal weight gain: Secondary | ICD-10-CM

## 2023-02-02 DIAGNOSIS — E7800 Pure hypercholesterolemia, unspecified: Secondary | ICD-10-CM | POA: Diagnosis not present

## 2023-02-02 DIAGNOSIS — R6882 Decreased libido: Secondary | ICD-10-CM

## 2023-02-02 DIAGNOSIS — Z853 Personal history of malignant neoplasm of breast: Secondary | ICD-10-CM | POA: Diagnosis not present

## 2023-02-02 MED ORDER — ESTRADIOL 0.1 MG/GM VA CREA: 0.5000 g | TOPICAL_CREAM | VAGINAL | 5 refills | Status: DC

## 2023-02-02 MED ORDER — OMEGA-3-ACID ETHYL ESTERS 1 G PO CAPS: 2.0000 g | ORAL_CAPSULE | Freq: Every day | ORAL | 5 refills | Status: DC

## 2023-02-02 MED ORDER — SUMATRIPTAN SUCCINATE 100 MG PO TABS: ORAL_TABLET | ORAL | 3 refills | Status: DC

## 2023-02-02 MED ORDER — HRT BASE CREA: TOPICAL_CREAM | 3 refills | Status: AC

## 2023-02-02 NOTE — Progress Notes (Signed)
Subjective:    Patient ID: Kristine Ortega, female    DOB: [birth year], 57 y.o.   MRN: [id number]  Patient requested testosterone cream and estrace cream recently which I had not been prescribing. Past medical history is significant for remote history of left breast cancer in 2004.  It was stage II, grade 3, negative lymph node ,ER, PR negative, Her 2 amplified, BRCA 2 mutated s/p bilateral mastectomy followed by adjuvant Adriamcyin, cytoxan, taxotere and Herceptin adjuvant chemotherapy followed by Herceptin for a total of 1 year followed by reconstruction.  She also had salpingo-oophorectomy as well.  Last cholesterol was very poor (LDL was 193).  Patient was hesitant to take a statin so we did a coronary artery calcium score last year.  Her coronary artery calcium score was unremarkable 0 despite her LDL being so high.  Therefore she elected against taking any statin Past Medical History:  Diagnosis Date   Anxiety    Cancer (HCC)    breast cancer   Complication of anesthesia    Depression    Headache(784.0)    Motion sickness    PONV (postoperative nausea and vomiting)    Past Surgical History:  Procedure Laterality Date   ABDOMINAL HYSTERECTOMY  2007   BSO   BREAST IMPLANT REMOVAL Bilateral 07/15/2017   Procedure: REMOVAL BREAST IMPLANTS;  Surgeon: Peggye Form, DO;  Location: [facility];  Service: Plastics;  Laterality: Bilateral;   BREAST RECONSTRUCTION  2005   implants-bilat   CARPAL TUNNEL RELEASE Right 05/16/2013   Procedure: CARPAL TUNNEL RELEASE RIGHT ;  Surgeon: Wyn Forster., MD;  Location: [facility];  Service: Orthopedics;  Laterality: Right;   CARPAL TUNNEL RELEASE Left 08/08/2013   Procedure: LEFT CARPAL TUNNEL RELEASE;  Surgeon: Wyn Forster., MD;  Location: [facility];  Service: Orthopedics;  Laterality: Left;   CESAREAN SECTION     x4   MASTECTOMY W/ SENTINEL NODE BIOPSY  2005   left-   PLACEMENT OF BREAST  IMPLANTS Bilateral 07/15/2017   Procedure: PLACEMENT OF BREAST IMPLANTS;  Surgeon: Peggye Form, DO;  Location: [facility];  Service: Plastics;  Laterality: Bilateral;   PORT-A-CATH REMOVAL  2007   insertion then out   SIMPLE MASTECTOMY  2005   right-prophalactic   TONSILLECTOMY     Current Outpatient Medications on File Prior to Visit  Medication Sig Dispense Refill   ALPRAZolam (XANAX) 0.5 MG tablet Take 1 tablet (0.5 mg total) by mouth every 8 (eight) hours. 30 tablet 0   Ascorbic Acid (VITAMIN C PO) Take by mouth.     BIOTIN PO Take by mouth. (Patient not taking: Reported on 03/19/2022)     cariprazine (VRAYLAR) 1.5 MG capsule Take 1 capsule (1.5 mg total) by mouth daily. (Patient not taking: Reported on 03/19/2022) 30 capsule 3   cariprazine (VRAYLAR) 3 MG capsule Take 1 capsule (3 mg total) by mouth daily. (Patient not taking: Reported on 03/19/2022) 30 capsule 1   clonazePAM (KLONOPIN) 0.5 MG tablet Take 1 tablet (0.5 mg total) by mouth 2 (two) times daily. 60 tablet 1   estradiol (ESTRACE VAGINAL) 0.1 MG/GM vaginal cream Place 0.5 g vaginally 2 (two) times a week. 42.5 g 3   Hormone Cream Base (HRT BASE) CREA TESTOSTERONE HRT ( ) 5% CREAM- APPLY A PEA-SIZE AMOUNT TO LABIA 5X WEEKLY 1000 g 3   hydrOXYzine (VISTARIL) 25 MG capsule Take 1 capsule (25 mg total) by mouth every  8 (eight) hours as needed for anxiety (sleep). (Patient not taking: Reported on 03/19/2022) 30 capsule 0   lamoTRIgine (LAMICTAL) 150 MG tablet Take 1 tablet (150 mg total) by mouth daily. 90 tablet 3   MAGNESIUM-OXIDE PO Take by mouth.     MELATONIN PO Take by mouth. (Patient not taking: Reported on 03/19/2022)     Multiple Vitamins-Minerals (WOMENS DAILY FORMULA) TABS Take by mouth. (Patient not taking: Reported on 03/19/2022)     OVER THE COUNTER MEDICATION chamomile, valerian root, hops blend (Patient not taking: Reported on 03/19/2022)     Rhodiola rosea (RHODIOLA PO) Take by mouth. Stress  Complex (Patient not taking: Reported on 03/19/2022)     sertraline (ZOLOFT) 100 MG tablet Take 1 tablet (100 mg total) by mouth daily. 30 tablet 3   SUMAtriptan (IMITREX) 100 MG tablet TAKE 1 TABLET DAILY AS     NEEDED. MAY REPEAT IN 2    HOURS IF NO BETTER (Patient not taking: Reported on 03/19/2022) 90 tablet 3   venlafaxine XR (EFFEXOR XR) 75 MG 24 hr capsule Take 2 capsules (150 mg total) by mouth daily with breakfast. 60 capsule 3   VITAMIN D PO Take by mouth. (Patient not taking: Reported on 03/19/2022)     No current facility-administered medications on file prior to visit.   No Known Allergies Social History   Socioeconomic History   Marital status: Married    Spouse name: Not on file   Number of children: Not on file   Years of education: Not on file   Highest education level: Not on file  Occupational History   Not on file  Tobacco Use   Smoking status: Never   Smokeless tobacco: Never  Substance and Sexual Activity   Alcohol use: No   Drug use: No   Sexual activity: Yes  Other Topics Concern   Not on file  Social History Narrative   Not on file   Social Determinants of Health   Financial Resource Strain: Not on file  Food Insecurity: Not on file  Transportation Needs: Not on file  Physical Activity: Not on file  Stress: Not on file  Social Connections: Not on file  Intimate Partner Violence: Not on file      Review of Systems  All other systems reviewed and are negative.      Objective:   Physical Exam Vitals reviewed.  Constitutional:      General: She is not in acute distress.    Appearance: Normal appearance. She is not ill-appearing or toxic-appearing.  Cardiovascular:     Rate and Rhythm: Normal rate and regular rhythm.     Heart sounds: Normal heart sounds. No murmur heard.    No friction rub. No gallop.  Neurological:     Mental Status: She is alert.  Psychiatric:        Attention and Perception: Attention and perception normal. She is  attentive. She does not perceive auditory or visual hallucinations.        Mood and Affect: Mood is anxious and depressed. Mood is not elated. Affect is not labile, blunt, flat, angry, tearful or inappropriate.        Speech: Speech normal. She is communicative. Speech is not rapid and pressured, delayed or slurred.        Behavior: Behavior normal.        Thought Content: Thought content normal.        Cognition and Memory: Cognition and memory normal.  Judgment: Judgment normal. Judgment is not impulsive or inappropriate.           Assessment & Plan:  Postmenopausal estrogen deficiency  History of breast cancer  Pure hypercholesterolemia - Plan: CBC with Differential/Platelet, COMPLETE METABOLIC PANEL WITH GFR, Lipid panel  Low libido - Plan: Testosterone  Weight gain - Plan: Hemoglobin A1c, TSH We discussed the potential risk of topical testosterone and estrogen cream.  The patient is primarily using the estrogen cream for vaginal atrophy and she elects to continue this.  She is using the topical testosterone for libido.  She chooses to continue this.  I am not concerned about recurrent breast cancer.  She is 20 years out and had a mastectomy but I do not feel that there was any residual breast tissue lying dormant that could turn into cancer at this point.  She is also had BSO along with a hysterectomy so not concerned about possible endometrial cancer.  Therefore as long as she is willing to accept the risk of the testosterone I would be willing to prescribe it to her.  We will check a CBC a CMP and lipid panel TSH and an A1c due to weight gain that she is experienced since starting Rexulti.  I did recommend she take Lovaza 2000 mg a day because of her cholesterol

## 2023-02-03 LAB — COMPLETE METABOLIC PANEL WITH GFR
AG Ratio: 1.8 (calc) (ref 1.0–2.5)
ALT: 16 U/L (ref 6–29)
AST: 15 U/L (ref 10–35)
Albumin: 4.6 g/dL (ref 3.6–5.1)
Alkaline phosphatase (APISO): 58 U/L (ref 37–153)
BUN: 15 mg/dL (ref 7–25)
CO2: 25 mmol/L (ref 20–32)
Calcium: 9.6 mg/dL (ref 8.6–10.4)
Chloride: 106 mmol/L (ref 98–110)
Creat: 0.8 mg/dL (ref 0.50–1.03)
Globulin: 2.6 g/dL (calc) (ref 1.9–3.7)
Glucose, Bld: 88 mg/dL (ref 65–99)
Potassium: 4.4 mmol/L (ref 3.5–5.3)
Sodium: 141 mmol/L (ref 135–146)
Total Bilirubin: 0.4 mg/dL (ref 0.2–1.2)
Total Protein: 7.2 g/dL (ref 6.1–8.1)
eGFR: 85 mL/min/{1.73_m2} (ref >=60)

## 2023-02-03 LAB — LIPID PANEL
Cholesterol: 299 mg/dL — ABNORMAL HIGH (ref <200)
HDL: 50 mg/dL (ref >=50)
LDL Cholesterol (Calc): 200 mg/dL (calc) — ABNORMAL HIGH
Non-HDL Cholesterol (Calc): 249 mg/dL (calc) — ABNORMAL HIGH (ref <130)
Total CHOL/HDL Ratio: 6 (calc) — ABNORMAL HIGH (ref <5.0)
Triglycerides: 275 mg/dL — ABNORMAL HIGH (ref <150)

## 2023-02-03 LAB — CBC WITH DIFFERENTIAL/PLATELET
Absolute Monocytes: 372 cells/uL (ref 200–950)
Basophils Absolute: 49 cells/uL (ref 0–200)
Basophils Relative: 1 %
Eosinophils Absolute: 20 cells/uL (ref 15–500)
Eosinophils Relative: 0.4 %
HCT: 42 % (ref 35.0–45.0)
Hemoglobin: 14 g/dL (ref 11.7–15.5)
Lymphs Abs: 1372 cells/uL (ref 850–3900)
MCH: 28.3 pg (ref 27.0–33.0)
MCHC: 33.3 g/dL (ref 32.0–36.0)
MCV: 84.8 fL (ref 80.0–100.0)
MPV: 10.6 fL (ref 7.5–12.5)
Monocytes Relative: 7.6 %
Neutro Abs: 3087 cells/uL (ref 1500–7800)
Neutrophils Relative %: 63 %
Platelets: 248 10*3/uL (ref 140–400)
RBC: 4.95 10*6/uL (ref 3.80–5.10)
RDW: 12.4 % (ref 11.0–15.0)
Total Lymphocyte: 28 %
WBC: 4.9 10*3/uL (ref 3.8–10.8)

## 2023-02-03 LAB — HEMOGLOBIN A1C
Hgb A1c MFr Bld: 5.5 % of total Hgb (ref <5.7)
Mean Plasma Glucose: 111 mg/dL
eAG (mmol/L): 6.2 mmol/L

## 2023-02-03 LAB — TESTOSTERONE: Testosterone: 16 ng/dL

## 2023-02-03 LAB — TSH: TSH: 1.33 mIU/L (ref 0.40–4.50)

## 2023-02-11 ENCOUNTER — Encounter: Payer: Self-pay | Admitting: Family Medicine

## 2023-04-01 ENCOUNTER — Other Ambulatory Visit: Payer: Self-pay

## 2023-04-01 ENCOUNTER — Encounter: Payer: Self-pay | Admitting: Family Medicine

## 2023-04-01 DIAGNOSIS — E7801 Familial hypercholesterolemia: Secondary | ICD-10-CM

## 2023-04-01 DIAGNOSIS — E7800 Pure hypercholesterolemia, unspecified: Secondary | ICD-10-CM

## 2023-04-01 MED ORDER — OMEGA-3-ACID ETHYL ESTERS 1 G PO CAPS
2.0000 g | ORAL_CAPSULE | Freq: Every day | ORAL | 1 refills | Status: DC
Start: 2023-04-01 — End: 2023-06-14

## 2023-06-04 DIAGNOSIS — Z1501 Genetic susceptibility to malignant neoplasm of breast: Secondary | ICD-10-CM | POA: Insufficient documentation

## 2023-06-12 ENCOUNTER — Other Ambulatory Visit: Payer: Self-pay | Admitting: Family Medicine

## 2023-06-12 DIAGNOSIS — E7800 Pure hypercholesterolemia, unspecified: Secondary | ICD-10-CM

## 2023-06-14 NOTE — Telephone Encounter (Signed)
Requested Prescriptions  Pending Prescriptions Disp Refills   omega-3 acid ethyl esters (LOVAZA) 1 g capsule [Pharmacy Med Name: OMEGA-3-ACID CAP 1GM(L)] 180 capsule 1    Sig: TAKE 2 CAPSULES DAILY     Endocrinology:  Nutritional Agents - omega-3 acid ethyl esters Failed - 06/12/2023  6:06 PM      Failed - Valid encounter within last 12 months    Recent Outpatient Visits           1 year ago Bipolar disorder, current episode depressed, severe, without psychotic features (HCC)   [facility] Pickard, Priscille Heidelberg, MD   1 year ago Bipolar disorder, current episode depressed, severe, without psychotic features (HCC)   [facility] Pickard, Priscille Heidelberg, MD   1 year ago Bipolar II disorder (HCC)   [facility] Pickard, Priscille Heidelberg, MD   1 year ago History of breast cancer   [facility] Valentino Nose, NP   2 years ago Bipolar II disorder [facility])   [facility] Cathlean Marseilles A, NP              Failed - Lipid Panel in normal range within the last 12 months    Cholesterol  Date Value Ref Range Status  02/02/2023 299 (H) <200 mg/dL Final   LDL Cholesterol (Calc)  Date Value Ref Range Status  02/02/2023 200 (H) mg/dL (calc) Final    Comment:    LDL-C levels > or = 190 mg/dL may indicate familial  hypercholesterolemia (FH). Clinical assessment and  measurement of blood lipid levels should be  considered for all first degree relatives of patients with an FH diagnosis. LDL Cholesterol (LDL-C) levels > or = 300 mg/dL may indicate homozygous familial hypercholesterolemia (HoFH). Untreated,  these extremely high LDL-C levels can result in premature CV events and mortality. Patients should be identified early and provided appropriate interventions to reduce the cumulative LDL-C burden from birth. . For questions about testing for familial hypercholesterolemia, please call Engineer, materials at  1.866.GENE.INFO. Wardell Honour, et al. J National Lipid Association  Recommendations for Patient-Centered Management of Dyslipidemia: Part 1 Journal of  Clinical Lipidology 2015;9(2), 129-169. Cuchel, M. et al. (2014). Homozygous familial hypercholesterolaemia: new insights and guidance for clinicians to improve detection and clinical management. European Heart Journal, 35(32), [phone]. Reference range: <100 . Desirable range <100 mg/dL for primary prevention;   <70 mg/dL for patients with CHD or diabetic patients  with > or = 2 CHD risk factors. Marland Kitchen LDL-C is now calculated using the Martin-Hopkins  calculation, which is a validated novel method providing  better accuracy than the Friedewald equation in the  estimation of LDL-C.  Horald Pollen et al. Lenox Ahr. 5409;811(91): 2061-2068  (http://education.QuestDiagnostics.com/faq/FAQ164)    HDL  Date Value Ref Range Status  02/02/2023 50 > OR = 50 mg/dL Final   Triglycerides  Date Value Ref Range Status  02/02/2023 275 (H) <150 mg/dL Final    Comment:    . If a non-fasting specimen was collected, consider repeat triglyceride testing on a fasting specimen if clinically indicated.  Perry Mount et al. J. of Clin. Lipidol. 2015;9:129-169. Marland Kitchen

## 2023-07-20 ENCOUNTER — Ambulatory Visit (INDEPENDENT_AMBULATORY_CARE_PROVIDER_SITE_OTHER): Payer: BC Managed Care – PPO

## 2023-07-20 DIAGNOSIS — Z23 Encounter for immunization: Secondary | ICD-10-CM

## 2023-07-20 NOTE — Progress Notes (Signed)
Here for flu vaccine. Given as per standing order. Pt tolerated well. Mjp,lpn

## 2023-11-05 ENCOUNTER — Other Ambulatory Visit: Payer: Self-pay | Admitting: Family Medicine

## 2023-11-05 DIAGNOSIS — E7800 Pure hypercholesterolemia, unspecified: Secondary | ICD-10-CM

## 2023-11-18 ENCOUNTER — Encounter: Payer: Self-pay | Admitting: Family Medicine

## 2023-11-18 ENCOUNTER — Ambulatory Visit: Admitting: Family Medicine

## 2023-11-18 VITALS — BP 124/82 | HR 88 | Temp 98.5°F | Ht 63.0 in | Wt 162.0 lb

## 2023-11-18 DIAGNOSIS — B351 Tinea unguium: Secondary | ICD-10-CM | POA: Diagnosis not present

## 2023-11-18 MED ORDER — TERBINAFINE HCL 250 MG PO TABS: 250.0000 mg | ORAL_TABLET | Freq: Every day | ORAL | 0 refills | Status: DC

## 2023-11-18 NOTE — Progress Notes (Signed)
 Subjective:  HPI: Kristine Ortega is a 58 y.o. female presenting on 11/18/2023 for Nail Problem (X several months. Left great toe. )   HPI Patient is in today for toenail discoloration for several months that started on her left great toe and has now spread to most toes on both feet. She denies trauma or pain. She does get pedicures. Has tried OTC treatments.   ROS  Relevant past medical history reviewed and updated as indicated.   Past Medical History:  Diagnosis Date   Anxiety    Cancer (HCC)    breast cancer   Complication of anesthesia    Depression    Headache(784.0)    Motion sickness    PONV (postoperative nausea and vomiting)      Past Surgical History:  Procedure Laterality Date   ABDOMINAL HYSTERECTOMY  2007   BSO   BREAST IMPLANT REMOVAL Bilateral 07/15/2017   Procedure: REMOVAL BREAST IMPLANTS;  Surgeon: Peggye Form, DO;  Location: [facility];  Service: Plastics;  Laterality: Bilateral;   BREAST RECONSTRUCTION  2005   implants-bilat   CARPAL TUNNEL RELEASE Right 05/16/2013   Procedure: CARPAL TUNNEL RELEASE RIGHT ;  Surgeon: Wyn Forster., MD;  Location: [facility];  Service: Orthopedics;  Laterality: Right;   CARPAL TUNNEL RELEASE Left 08/08/2013   Procedure: LEFT CARPAL TUNNEL RELEASE;  Surgeon: Wyn Forster., MD;  Location: [facility];  Service: Orthopedics;  Laterality: Left;   CESAREAN SECTION     x4   MASTECTOMY W/ SENTINEL NODE BIOPSY  2005   left-   PLACEMENT OF BREAST IMPLANTS Bilateral 07/15/2017   Procedure: PLACEMENT OF BREAST IMPLANTS;  Surgeon: Peggye Form, DO;  Location: [facility];  Service: Plastics;  Laterality: Bilateral;   PORT-A-CATH REMOVAL  2007   insertion then out   SIMPLE MASTECTOMY  2005   right-prophalactic   TONSILLECTOMY      Allergies and medications reviewed and updated.   Current Outpatient Medications:    estradiol (ESTRACE VAGINAL)  0.1 MG/GM vaginal cream, Place 0.5 g vaginally 2 (two) times a week., Disp: 42.5 g, Rfl: 5   Hormone Cream Base (HRT BASE) CREA, TESTOSTERONE HRT ( ) 5% CREAM- APPLY A PEA-SIZE AMOUNT TO LABIA 5X WEEKLY, Disp: 1000 g, Rfl: 3   lamoTRIgine (LAMICTAL) 200 MG tablet, Take 200 mg by mouth daily., Disp: , Rfl:    omega-3 acid ethyl esters (LOVAZA) 1 g capsule, TAKE 2 CAPSULES DAILY, Disp: 180 capsule, Rfl: 1   REXULTI 2 MG TABS tablet, Take 2 mg by mouth daily., Disp: , Rfl:    SUMAtriptan (IMITREX) 100 MG tablet, TAKE 1 TABLET DAILY AS     NEEDED. MAY REPEAT IN 2    HOURS IF NO BETTER, Disp: 90 tablet, Rfl: 3   terbinafine (LAMISIL) 250 MG tablet, Take 1 tablet (250 mg total) by mouth daily., Disp: 90 tablet, Rfl: 0   TRINTELLIX 20 MG TABS tablet, Take 20 mg by mouth daily., Disp: , Rfl:    ALPRAZolam (XANAX) 0.5 MG tablet, Take 1 tablet (0.5 mg total) by mouth every 8 (eight) hours. (Patient not taking: Reported on 11/18/2023), Disp: 30 tablet, Rfl: 0   BIOTIN PO, Take by mouth. (Patient not taking: Reported on 03/19/2022), Disp: , Rfl:    clonazePAM (KLONOPIN) 0.5 MG tablet, Take 1 tablet (0.5 mg total) by mouth 2 (two) times daily. (Patient not taking: Reported on 11/18/2023), Disp: 60 tablet, Rfl:  1   MELATONIN PO, Take by mouth. (Patient not taking: Reported on 03/19/2022), Disp: , Rfl:    Multiple Vitamins-Minerals (WOMENS DAILY FORMULA) TABS, Take by mouth. (Patient not taking: Reported on 03/19/2022), Disp: , Rfl:    VITAMIN D PO, Take by mouth. (Patient not taking: Reported on 03/19/2022), Disp: , Rfl:   No Known Allergies  Objective:   BP 124/82   Pulse 88   Temp 98.5 F (36.9 C)   Ht 5\' 3"  (1.6 m)   Wt 162 lb (73.5 kg)   SpO2 99%   BMI 28.70 kg/m      11/18/2023    3:51 PM 02/02/2023    9:06 AM 03/19/2022    9:08 AM  Vitals with BMI  Height 5\' 3"  5\' 3"  5\' 3"   Weight 162 lbs 159 lbs 3 oz 139 lbs  BMI 28.7 28.21 24.63  Systolic 124 114 914  Diastolic 82 62 90  Pulse 88 76 93      Physical Exam Vitals and nursing note reviewed.  Constitutional:      Appearance: Normal appearance. She is normal weight.  HENT:     Head: Normocephalic and atraumatic.  Skin:    General: Skin is warm and dry.     Comments: White discoloration to bilateral toenails with yellow discoloration and thickening of left great toe  Neurological:     General: No focal deficit present.     Mental Status: She is alert and oriented to person, place, and time. Mental status is at baseline.  Psychiatric:        Mood and Affect: Mood normal.        Behavior: Behavior normal.        Thought Content: Thought content normal.        Judgment: Judgment normal.     Assessment & Plan:  Onychomycosis Assessment & Plan: White discoloration to bilateral toenails with yellow discoloration and thickening of left great toe. Discussed treatment options and would like to proceed with oral treatment. Start Terbinafine 250mg  daily for 12 weeks. CMP today and will repeat monthly. Normal past LFTs. Denies alcohol use or liver disease. Would also like referral to podiatry.  Orders: -     COMPLETE METABOLIC PANEL WITH GFR; Standing -     Ambulatory referral to Podiatry  Other orders -     Terbinafine HCl; Take 1 tablet (250 mg total) by mouth daily.  Dispense: 90 tablet; Refill: 0     Follow up plan: Return in about 4 weeks (around 12/16/2023) for labs.  Park Meo, FNP

## 2023-11-18 NOTE — Assessment & Plan Note (Signed)
 White discoloration to bilateral toenails with yellow discoloration and thickening of left great toe. Discussed treatment options and would like to proceed with oral treatment. Start Terbinafine 250mg  daily for 12 weeks. CMP today and will repeat monthly. Normal past LFTs. Denies alcohol use or liver disease. Would also like referral to podiatry.

## 2023-11-19 ENCOUNTER — Ambulatory Visit: Payer: BC Managed Care – PPO | Admitting: Family Medicine

## 2023-11-19 LAB — COMPLETE METABOLIC PANEL WITH GFR
AG Ratio: 1.6 (calc) (ref 1.0–2.5)
ALT: 23 U/L (ref 6–29)
AST: 22 U/L (ref 10–35)
Albumin: 4.6 g/dL (ref 3.6–5.1)
Alkaline phosphatase (APISO): 64 U/L (ref 37–153)
BUN/Creatinine Ratio: 17 (calc) (ref 6–22)
BUN: 19 mg/dL (ref 7–25)
CO2: 27 mmol/L (ref 20–32)
Calcium: 10 mg/dL (ref 8.6–10.4)
Chloride: 103 mmol/L (ref 98–110)
Creat: 1.14 mg/dL — ABNORMAL HIGH (ref 0.50–1.03)
Globulin: 2.8 g/dL (ref 1.9–3.7)
Glucose, Bld: 87 mg/dL (ref 65–99)
Potassium: 4.7 mmol/L (ref 3.5–5.3)
Sodium: 139 mmol/L (ref 135–146)
Total Bilirubin: 0.3 mg/dL (ref 0.2–1.2)
Total Protein: 7.4 g/dL (ref 6.1–8.1)
eGFR: 56 mL/min/{1.73_m2} — ABNORMAL LOW (ref >=60)

## 2023-11-23 ENCOUNTER — Other Ambulatory Visit: Payer: Self-pay | Admitting: Family Medicine

## 2023-11-23 ENCOUNTER — Encounter: Payer: Self-pay | Admitting: Family Medicine

## 2023-11-23 DIAGNOSIS — R944 Abnormal results of kidney function studies: Secondary | ICD-10-CM

## 2023-11-25 ENCOUNTER — Other Ambulatory Visit

## 2023-11-25 DIAGNOSIS — R944 Abnormal results of kidney function studies: Secondary | ICD-10-CM

## 2023-11-26 ENCOUNTER — Ambulatory Visit
Admission: RE | Admit: 2023-11-26 | Discharge: 2023-11-26 | Disposition: A | Discharge status: Home / Self Care | Source: Ambulatory Visit | Attending: Family Medicine | Admitting: Family Medicine

## 2023-11-26 DIAGNOSIS — R944 Abnormal results of kidney function studies: Secondary | ICD-10-CM

## 2023-11-27 LAB — PROTEIN / CREATININE RATIO, URINE
Creatinine, Urine: 141 mg/dL (ref 20–275)
Protein/Creat Ratio: 57 mg/g{creat} (ref 24–184)
Protein/Creatinine Ratio: 0.057 mg/mg{creat} (ref 0.024–0.184)
Total Protein, Urine: 8 mg/dL (ref 5–24)

## 2023-12-03 ENCOUNTER — Other Ambulatory Visit

## 2023-12-03 DIAGNOSIS — N289 Disorder of kidney and ureter, unspecified: Secondary | ICD-10-CM

## 2023-12-03 DIAGNOSIS — R635 Abnormal weight gain: Secondary | ICD-10-CM

## 2023-12-04 LAB — BASIC METABOLIC PANEL WITH GFR
BUN: 14 mg/dL (ref 7–25)
CO2: 28 mmol/L (ref 20–32)
Calcium: 9.5 mg/dL (ref 8.6–10.4)
Chloride: 105 mmol/L (ref 98–110)
Creat: 0.89 mg/dL (ref 0.50–1.03)
Glucose, Bld: 86 mg/dL (ref 65–99)
Potassium: 4.3 mmol/L (ref 3.5–5.3)
Sodium: 142 mmol/L (ref 135–146)
eGFR: 75 mL/min/{1.73_m2} (ref >=60)

## 2023-12-17 ENCOUNTER — Other Ambulatory Visit

## 2023-12-23 ENCOUNTER — Encounter: Payer: Self-pay | Admitting: Podiatry

## 2023-12-23 ENCOUNTER — Ambulatory Visit (INDEPENDENT_AMBULATORY_CARE_PROVIDER_SITE_OTHER): Admitting: Podiatry

## 2023-12-23 DIAGNOSIS — B351 Tinea unguium: Secondary | ICD-10-CM

## 2023-12-23 NOTE — Progress Notes (Signed)
 Chief Complaint  Patient presents with   Nail Problem    "I think I have an infection on my toe." N - toenail infection L - 1-5  bilateral D - 8 - 10 mos O - gradually worse C - discolored, thick, white areas A - none T - Apple cider vinegar, Vicks, Athletes feet cream, Terbinafine  for 1 month    HPI: 58 y.o. female presents today with concern for fungal nail infection affecting toenails 1 through 5 bilaterally.  Left first toenail for the most effective overall.  She has actually been on oral morphine from her other provider for close to a month at this point.  Prior to this she has tried apple cider vinegar, Vicks vapor rub, athlete's foot cream.  Past Medical History:  Diagnosis Date   Anxiety    Cancer (HCC)    breast cancer   Complication of anesthesia    Depression    Headache(784.0)    Motion sickness    PONV (postoperative nausea and vomiting)     Past Surgical History:  Procedure Laterality Date   ABDOMINAL HYSTERECTOMY  2007   BSO   BREAST IMPLANT REMOVAL Bilateral 07/15/2017   Procedure: REMOVAL BREAST IMPLANTS;  Surgeon: Thornell Flirt, DO;  Location: [facility];  Service: Plastics;  Laterality: Bilateral;   BREAST RECONSTRUCTION  2005   implants-bilat   CARPAL TUNNEL RELEASE Right 05/16/2013   Procedure: CARPAL TUNNEL RELEASE RIGHT ;  Surgeon: Amelie Baize., MD;  Location: [facility];  Service: Orthopedics;  Laterality: Right;   CARPAL TUNNEL RELEASE Left 08/08/2013   Procedure: LEFT CARPAL TUNNEL RELEASE;  Surgeon: Amelie Baize., MD;  Location: [facility];  Service: Orthopedics;  Laterality: Left;   CESAREAN SECTION     x4   MASTECTOMY W/ SENTINEL NODE BIOPSY  2005   left-   PLACEMENT OF BREAST IMPLANTS Bilateral 07/15/2017   Procedure: PLACEMENT OF BREAST IMPLANTS;  Surgeon: Thornell Flirt, DO;  Location:  S[facility];  Service: Plastics;  Laterality: Bilateral;    PORT-A-CATH REMOVAL  2007   insertion then out   SIMPLE MASTECTOMY  2005   right-prophalactic   TONSILLECTOMY      No Known Allergies  ROS    Physical Exam: There were no vitals filed for this visit.  General: The patient is alert and oriented x3 in no acute distress.  Dermatology: Skin is warm, dry and supple bilateral lower extremities. Interspaces are clear of maceration and debris.  Nail plates x 10 are thickened, yellow, dystrophic with subungual subungual debris.  Appearance consistent with mycotic infection.  Left hallux nail plate most affected overall.  Vascular: Palpable pedal pulses bilaterally. Capillary refill within normal limits.  No appreciable edema.  No erythema or calor.  Neurological: Light touch sensation grossly intact bilateral feet.   Musculoskeletal Exam: No pedal deformities noted   Assessment/Plan of Care: 1. Onychomycosis      No orders of the defined types were placed in this encounter.  None  Discussed clinical findings with patient today.  No specimen obtained to be sent for fungal pathology.  She can continue the oral terbinafine  at this time as the nails do appear mycotic in nature.  She does have a refill from prior prescriber.  Follow-up in 3 to 4 weeks to review nail pathology.  Did review labs from 3/13 showing no elevation of AST or ALT.   Alastair Hennes L.  Lunda Salines, AACFAS [facility]     [address] [place], [place] [address]                Office [phone]  Fax [phone]

## 2023-12-24 ENCOUNTER — Encounter: Payer: Self-pay | Admitting: Podiatry

## 2023-12-31 ENCOUNTER — Other Ambulatory Visit

## 2024-01-04 ENCOUNTER — Other Ambulatory Visit: Payer: Self-pay | Admitting: Podiatry

## 2024-01-14 ENCOUNTER — Encounter: Payer: Self-pay | Admitting: Podiatry

## 2024-01-14 ENCOUNTER — Ambulatory Visit (INDEPENDENT_AMBULATORY_CARE_PROVIDER_SITE_OTHER): Admitting: Podiatry

## 2024-01-14 VITALS — Ht 63.0 in | Wt 162.0 lb

## 2024-01-14 DIAGNOSIS — M79674 Pain in right toe(s): Secondary | ICD-10-CM | POA: Diagnosis not present

## 2024-01-14 DIAGNOSIS — B351 Tinea unguium: Secondary | ICD-10-CM | POA: Diagnosis not present

## 2024-01-14 DIAGNOSIS — M79675 Pain in left toe(s): Secondary | ICD-10-CM

## 2024-01-14 MED ORDER — TERBINAFINE HCL 250 MG PO TABS
250.0000 mg | ORAL_TABLET | Freq: Every day | ORAL | 0 refills | Status: DC
Start: 2024-01-14 — End: 2024-02-11

## 2024-01-14 NOTE — Progress Notes (Unsigned)
 Chief Complaint  Patient presents with   Nail Problem    Pt is here to discuss nail pathology results.    HPI: 58 y.o. female presents today with concern for fungal nail infection affecting toenails 1 through 5 bilaterally.  She has been on oral terbinafine  for about 60 days at this point from another provider.  Here to review nail pathology.  Past Medical History:  Diagnosis Date   Anxiety    Cancer (HCC)    breast cancer   Complication of anesthesia    Depression    Headache(784.0)    Motion sickness    PONV (postoperative nausea and vomiting)     Past Surgical History:  Procedure Laterality Date   ABDOMINAL HYSTERECTOMY  2007   BSO   BREAST IMPLANT REMOVAL Bilateral 07/15/2017   Procedure: REMOVAL BREAST IMPLANTS;  Surgeon: Thornell Flirt, DO;  Location: [facility];  Service: Plastics;  Laterality: Bilateral;   BREAST RECONSTRUCTION  2005   implants-bilat   CARPAL TUNNEL RELEASE Right 05/16/2013   Procedure: CARPAL TUNNEL RELEASE RIGHT ;  Surgeon: Amelie Baize., MD;  Location: [facility];  Service: Orthopedics;  Laterality: Right;   CARPAL TUNNEL RELEASE Left 08/08/2013   Procedure: LEFT CARPAL TUNNEL RELEASE;  Surgeon: Amelie Baize., MD;  Location: [facility];  Service: Orthopedics;  Laterality: Left;   CESAREAN SECTION     x4   MASTECTOMY W/ SENTINEL NODE BIOPSY  2005   left-   PLACEMENT OF BREAST IMPLANTS Bilateral 07/15/2017   Procedure: PLACEMENT OF BREAST IMPLANTS;  Surgeon: Thornell Flirt, DO;  Location:  SUR[facility];  Service: Plastics;  Laterality: Bilateral;   PORT-A-CATH REMOVAL  2007   insertion then out   SIMPLE MASTECTOMY  2005   right-prophalactic   TONSILLECTOMY      No Known Allergies  ROS    Physical Exam: There were no vitals filed for this visit.  General: The patient is alert and oriented x3 in no acute distress.  Dermatology: Skin is warm, dry and  supple bilateral lower extremities. Interspaces are clear of maceration and debris.  Nail plates x 10 are thickened, yellow, dystrophic with subungual subungual debris.  Appearance consistent with mycotic infection.  Left hallux nail plate most affected overall.  Vascular: Palpable pedal pulses bilaterally. Capillary refill within normal limits.  No appreciable edema.  No erythema or calor.  Neurological: Light touch sensation grossly intact bilateral feet.   Musculoskeletal Exam: No pedal deformities noted   Assessment/Plan of Care: 1. Pain due to onychomycosis of toenails of both feet      Meds ordered this encounter  Medications   terbinafine  (LAMISIL ) 250 MG tablet    Sig: Take 1 tablet (250 mg total) by mouth daily.    Dispense:  90 tablet    Refill:  0   None  Discussed clinical findings with patient today.  Nail pathology positive for Trichophyton rubrum, susceptible to oral terbinafine .  We will have her continue this and follow-up in 1 month after she has completed full 90 days.  Did go ahead and send in second 90-day course of terbinafine  due to the amount of residual mycotic nail present.  Follow-up in 1 month for lab work.   Astoria Condon L. Lunda Salines, AACFAS [facility]     [address]  [place], [place] [address]                Office [phone]  Fax [phone]

## 2024-01-16 ENCOUNTER — Encounter: Payer: Self-pay | Admitting: Podiatry

## 2024-02-11 ENCOUNTER — Ambulatory Visit (INDEPENDENT_AMBULATORY_CARE_PROVIDER_SITE_OTHER): Admitting: Podiatry

## 2024-02-11 ENCOUNTER — Encounter: Payer: Self-pay | Admitting: Podiatry

## 2024-02-11 DIAGNOSIS — M79674 Pain in right toe(s): Secondary | ICD-10-CM

## 2024-02-11 DIAGNOSIS — M79675 Pain in left toe(s): Secondary | ICD-10-CM

## 2024-02-11 DIAGNOSIS — B351 Tinea unguium: Secondary | ICD-10-CM | POA: Diagnosis not present

## 2024-02-11 MED ORDER — TERBINAFINE HCL 250 MG PO TABS: 250.0000 mg | ORAL_TABLET | Freq: Every day | ORAL | 0 refills | Status: DC

## 2024-02-11 NOTE — Progress Notes (Signed)
 Chief Complaint  Patient presents with   Nail Problem    Rm17 Follow up onychomycosis    HPI: 58 y.o. female presents today following up for onychomycosis.  She is completed 90-day course of terbinafine  at this point, initially started by outside provider.  Does report improvement in appearance of the toenails overall most notably the left hallux.  Past Medical History:  Diagnosis Date   Anxiety    Cancer (HCC)    breast cancer   Complication of anesthesia    Depression    Headache(784.0)    Motion sickness    PONV (postoperative nausea and vomiting)     Past Surgical History:  Procedure Laterality Date   ABDOMINAL HYSTERECTOMY  2007   BSO   BREAST IMPLANT REMOVAL Bilateral 07/15/2017   Procedure: REMOVAL BREAST IMPLANTS;  Surgeon: Thornell Flirt, DO;  Location: [facility];  Service: Plastics;  Laterality: Bilateral;   BREAST RECONSTRUCTION  2005   implants-bilat   CARPAL TUNNEL RELEASE Right 05/16/2013   Procedure: CARPAL TUNNEL RELEASE RIGHT ;  Surgeon: Amelie Baize., MD;  Location: [facility];  Service: Orthopedics;  Laterality: Right;   CARPAL TUNNEL RELEASE Left 08/08/2013   Procedure: LEFT CARPAL TUNNEL RELEASE;  Surgeon: Amelie Baize., MD;  Location: [facility];  Service: Orthopedics;  Laterality: Left;   CESAREAN SECTION     x4   MASTECTOMY W/ SENTINEL NODE BIOPSY  2005   left-   PLACEMENT OF BREAST IMPLANTS Bilateral 07/15/2017   Procedure: PLACEMENT OF BREAST IMPLANTS;  Surgeon: Thornell Flirt, DO;  Location:  SUR[facility]ERY CENTER;  Service: Plastics;  Laterality: Bilateral;   PORT-A-CATH REMOVAL  2007   insertion then out   SIMPLE MASTECTOMY  2005   right-prophalactic   TONSILLECTOMY      No Known Allergies  ROS    Physical Exam: There were no vitals filed for this visit.  General: The patient is alert and oriented x3 in no acute distress.  Dermatology: Skin is warm, dry and  supple bilateral lower extremities. Interspaces are clear of maceration and debris.  Nail plates overall showing signs of clear nail growth.  Left hallux most affected showing 30 to 40% clear nail growth medial aspect, central and lateral portions of the nail plate approximately 50%.  Remaining lesser toenails have chalky white appearance present.  Vascular: Palpable pedal pulses bilaterally. Capillary refill within normal limits.  No appreciable edema.  No erythema or calor.  Neurological: Light touch sensation grossly intact bilateral feet.   Musculoskeletal Exam: No pedal deformities noted   Assessment/Plan of Care: 1. Pain due to onychomycosis of toenails of both feet      Meds ordered this encounter  Medications   terbinafine  (LAMISIL ) 250 MG tablet    Sig: Take 1 tablet (250 mg total) by mouth daily.    Dispense:  90 tablet    Refill:  0   None  Discussed clinical findings with patient today.  At this point patient has completed 90-day course.  Advised patient that given extent of residual nail changes would likely benefit from a second 90-day course.  Lamisil  sent into patient's pharmacy.  Ordering new CBC and LFT.  Will notify patient of any abnormal results which would require any change of treatment. See media from 02/11/24.  Follow-up in 3 months for onychomycosis/med check.   Rick Carruthers L. Lunda Salines, AACFAS [facility]  [address] [place], [place] [address]                Office [phone]  Fax [phone]

## 2024-02-12 LAB — HEPATIC FUNCTION PANEL
ALT: 22 IU/L (ref 0–32)
AST: 19 IU/L (ref 0–40)
Albumin: 4.6 g/dL (ref 3.8–4.9)
Alkaline Phosphatase: 81 IU/L (ref 44–121)
Bilirubin Total: 0.3 mg/dL (ref 0.0–1.2)
Bilirubin, Direct: <0.08 mg/dL (ref 0.00–0.40)
Total Protein: 6.8 g/dL (ref 6.0–8.5)

## 2024-02-12 LAB — CBC
Hematocrit: 45.5 % (ref 34.0–46.6)
Hemoglobin: 14.9 g/dL (ref 11.1–15.9)
MCH: 28.3 pg (ref 26.6–33.0)
MCHC: 32.7 g/dL (ref 31.5–35.7)
MCV: 87 fL (ref 79–97)
Platelets: 269 10*3/uL (ref 150–450)
RBC: 5.26 x10E6/uL (ref 3.77–5.28)
RDW: 12.8 % (ref 11.7–15.4)
WBC: 5.2 10*3/uL (ref 3.4–10.8)

## 2024-02-18 ENCOUNTER — Other Ambulatory Visit

## 2024-03-15 ENCOUNTER — Ambulatory Visit: Admitting: Family Medicine

## 2024-03-15 ENCOUNTER — Encounter: Payer: Self-pay | Admitting: Family Medicine

## 2024-03-15 VITALS — BP 125/89 | HR 78 | Temp 97.6°F | Ht 63.0 in | Wt 173.8 lb

## 2024-03-15 DIAGNOSIS — Z6830 Body mass index (BMI) 30.0-30.9, adult: Secondary | ICD-10-CM

## 2024-03-15 DIAGNOSIS — E66811 Obesity, class 1: Secondary | ICD-10-CM | POA: Diagnosis not present

## 2024-03-15 MED ORDER — ZEPBOUND 2.5 MG/0.5ML ~~LOC~~ SOAJ: 2.5000 mg | SUBCUTANEOUS | 0 refills | Status: DC

## 2024-03-15 NOTE — Assessment & Plan Note (Signed)
 CMP, TSH, A1c, Vitamin D  today. Counseled on importance of a strong foundation of diet and exercise. She has been working on this and feeling overwhelmed by lack of progress for several months. Would like to proceed with medication at this time. Not a candidate for Phentermine due to stimulant effects. Start Zepbound  and check coverage. No history of pancreatitis and personal or family history of MEN2 or MTC Zepbound  2.5mg  weekly and increase as tolerated. Discussed risks and benefits. Follow up in 4 weeks or sooner if needed.

## 2024-03-15 NOTE — Progress Notes (Signed)
 Subjective:  HPI: Kristine Ortega is a 58 y.o. female presenting on 03/15/2024 for Medical Management of Chronic Issues (F/u /Severe wt. Gain . Would like to discuss wt. Loss options )   HPI Patient is in today for concerns of weight gain since being on several new medications last year with psychiatry. She reports a 40 pound weight gain. Reports an increased appetite and has not been motivated to exercise or going to the gym as regularly. Has discussed treatment options with psychiatry and they would not like to make medication adjustments due to severity of symptoms last year and difficulty optimizing treatment of anxiety and depression. She would not like to go to [facility] due to anxiety and feeling overwhelmed. Would like jumpstart medication to help her feel motivated.  WEIGHT MANAGEMENT Starting BMI: 30.79 Current BMI: 30.79 Goal weight: 145 Diet: regular Exercise: Sedentary Medication: n/a Side Effects: n/a   Review of Systems  All other systems reviewed and are negative.   Relevant past medical history reviewed and updated as indicated.   Past Medical History:  Diagnosis Date   Anxiety    Cancer (HCC)    breast cancer   Complication of anesthesia    Depression    Headache(784.0)    Motion sickness    PONV (postoperative nausea and vomiting)      Past Surgical History:  Procedure Laterality Date   ABDOMINAL HYSTERECTOMY  2007   BSO   BREAST IMPLANT REMOVAL Bilateral 07/15/2017   Procedure: REMOVAL BREAST IMPLANTS;  Surgeon: Lowery Estefana RAMAN, DO;  Location: [facility];  Service: Plastics;  Laterality: Bilateral;   BREAST RECONSTRUCTION  2005   implants-bilat   CARPAL TUNNEL RELEASE Right 05/16/2013   Procedure: CARPAL TUNNEL RELEASE RIGHT ;  Surgeon: Lamar LULLA Leonor Mickey., MD;  Location: [facility];  Service: Orthopedics;  Laterality: Right;   CARPAL TUNNEL RELEASE Left 08/08/2013   Procedure: LEFT CARPAL TUNNEL RELEASE;  Surgeon: Lamar LULLA Leonor Mickey., MD;  Location: [facility];  Service: Orthopedics;  Laterality: Left;   CESAREAN SECTION     x4   MASTECTOMY W/ SENTINEL NODE BIOPSY  2005   left-   PLACEMENT OF BREAST IMPLANTS Bilateral 07/15/2017   Procedure: PLACEMENT OF BREAST IMPLANTS;  Surgeon: Lowery Estefana RAMAN, DO;  Location: [facility];  Service: Plastics;  Laterality: Bilateral;   PORT-A-CATH REMOVAL  2007   insertion then out   SIMPLE MASTECTOMY  2005   right-prophalactic   TONSILLECTOMY      Allergies and medications reviewed and updated.   Current Outpatient Medications:    BIOTIN PO, Take by mouth., Disp: , Rfl:    buPROPion (WELLBUTRIN SR) 150 MG 12 hr tablet, Take 150 mg by mouth every morning., Disp: , Rfl:    estradiol  (ESTRACE  VAGINAL) 0.1 MG/GM vaginal cream, Place 0.5 g vaginally 2 (two) times a week., Disp: 42.5 g, Rfl: 5   Hormone Cream Base (HRT BASE) CREA, TESTOSTERONE  HRT ( ) 5% CREAM- APPLY A PEA-SIZE AMOUNT TO LABIA 5X WEEKLY, Disp: 1000 g, Rfl: 3   lamoTRIgine  (LAMICTAL ) 200 MG tablet, Take 200 mg by mouth daily., Disp: , Rfl:    MELATONIN PO, Take by mouth., Disp: , Rfl:    omega-3 acid ethyl esters (LOVAZA ) 1 g capsule, TAKE 2 CAPSULES DAILY, Disp: 180 capsule, Rfl: 1   REXULTI 2 MG TABS tablet, Take 2 mg by mouth daily., Disp: , Rfl:    SUMAtriptan  (IMITREX ) 100 MG  tablet, TAKE 1 TABLET DAILY AS     NEEDED. MAY REPEAT IN 2    HOURS IF NO BETTER, Disp: 90 tablet, Rfl: 3   terbinafine  (LAMISIL ) 250 MG tablet, Take 1 tablet (250 mg total) by mouth daily., Disp: 90 tablet, Rfl: 0   terbinafine  (LAMISIL ) 250 MG tablet, Take 250 mg by mouth daily., Disp: , Rfl:    TESTOSTERONE  PROPIONATE TD, 2 % by Subdermal route daily as needed (3-5 times weekly)., Disp: , Rfl:    TRINTELLIX  20 MG TABS tablet, Take 20 mg by mouth daily., Disp: , Rfl:    ALPRAZolam  (XANAX ) 0.5 MG tablet, Take 1 tablet (0.5 mg total) by mouth every 8 (eight) hours. (Patient not taking: Reported on  02/11/2024), Disp: 30 tablet, Rfl: 0   clonazePAM  (KLONOPIN ) 0.5 MG tablet, Take 1 tablet (0.5 mg total) by mouth 2 (two) times daily. (Patient not taking: Reported on 02/11/2024), Disp: 60 tablet, Rfl: 1   Multiple Vitamins-Minerals (WOMENS DAILY FORMULA) TABS, Take by mouth. (Patient not taking: Reported on 02/11/2024), Disp: , Rfl:    tirzepatide  (ZEPBOUND ) 2.5 MG/0.5ML Pen, Inject 2.5 mg into the skin once a week., Disp: 2 mL, Rfl: 0   VITAMIN D  PO, Take by mouth. (Patient not taking: Reported on 02/11/2024), Disp: , Rfl:   No Known Allergies  Objective:   BP 125/89   Pulse 78   Temp 97.6 F (36.4 C)   Ht 5' 3 (1.6 m)   Wt 173 lb 12.8 oz (78.8 kg)   SpO2 95%   BMI 30.79 kg/m      03/15/2024   10:33 AM 01/14/2024    8:59 AM 11/18/2023    3:51 PM  Vitals with BMI  Height 5' 3 5' 3 5' 3  Weight 173 lbs 13 oz 162 lbs 162 lbs  BMI 30.79 28.7 28.7  Systolic 125  124  Diastolic 89  82  Pulse 78  88     Physical Exam Vitals and nursing note reviewed.  Constitutional:      Appearance: Normal appearance. She is obese.  HENT:     Head: Normocephalic and atraumatic.  Skin:    General: Skin is warm and dry.  Neurological:     General: No focal deficit present.     Mental Status: She is alert and oriented to person, place, and time. Mental status is at baseline.  Psychiatric:        Mood and Affect: Mood normal.        Behavior: Behavior normal.        Thought Content: Thought content normal.        Judgment: Judgment normal.     Assessment & Plan:  Class 1 obesity without serious comorbidity with body mass index (BMI) of 30.0 to 30.9 in adult, unspecified obesity type Assessment & Plan: CMP, TSH, A1c, Vitamin D  today. Counseled on importance of a strong foundation of diet and exercise. She has been working on this and feeling overwhelmed by lack of progress for several months. Would like to proceed with medication at this time. Not a candidate for Phentermine due to stimulant  effects. Start Zepbound  and check coverage. No history of pancreatitis and personal or family history of MEN2 or MTC Zepbound  2.5mg  weekly and increase as tolerated. Discussed risks and benefits. Follow up in 4 weeks or sooner if needed.  Orders: -     Comprehensive metabolic panel with GFR -     TSH -  Hemoglobin A1c -     VITAMIN D  25 Hydroxy (Vit-D Deficiency, Fractures) -     CBC with Differential/Platelet -     Lipid panel  Other orders -     Zepbound ; Inject 2.5 mg into the skin once a week.  Dispense: 2 mL; Refill: 0     Follow up plan: Return for annual physical with labs 1 week prior.  Jeoffrey GORMAN Barrio, FNP

## 2024-03-16 ENCOUNTER — Ambulatory Visit: Payer: Self-pay | Admitting: Family Medicine

## 2024-03-16 ENCOUNTER — Encounter: Payer: Self-pay | Admitting: Family Medicine

## 2024-03-16 LAB — CBC WITH DIFFERENTIAL/PLATELET
Absolute Lymphocytes: 1262 {cells}/uL (ref 850–3900)
Absolute Monocytes: 326 {cells}/uL (ref 200–950)
Basophils Absolute: 29 {cells}/uL (ref 0–200)
Basophils Relative: 0.6 %
Eosinophils Absolute: 0 {cells}/uL — ABNORMAL LOW (ref 15–500)
Eosinophils Relative: 0 %
HCT: 45.5 % — ABNORMAL HIGH (ref 35.0–45.0)
Hemoglobin: 14.9 g/dL (ref 11.7–15.5)
MCH: 28.3 pg (ref 27.0–33.0)
MCHC: 32.7 g/dL (ref 32.0–36.0)
MCV: 86.3 fL (ref 80.0–100.0)
MPV: 10.3 fL (ref 7.5–12.5)
Monocytes Relative: 6.8 %
Neutro Abs: 3182 {cells}/uL (ref 1500–7800)
Neutrophils Relative %: 66.3 %
Platelets: 261 Thousand/uL (ref 140–400)
RBC: 5.27 Million/uL — ABNORMAL HIGH (ref 3.80–5.10)
RDW: 12.8 % (ref 11.0–15.0)
Total Lymphocyte: 26.3 %
WBC: 4.8 Thousand/uL (ref 3.8–10.8)

## 2024-03-16 LAB — LIPID PANEL
Cholesterol: 339 mg/dL — ABNORMAL HIGH (ref <200)
HDL: 48 mg/dL — ABNORMAL LOW (ref >=50)
LDL Cholesterol (Calc): 244 mg/dL — ABNORMAL HIGH
Non-HDL Cholesterol (Calc): 291 mg/dL — ABNORMAL HIGH (ref <130)
Total CHOL/HDL Ratio: 7.1 (calc) — ABNORMAL HIGH (ref <5.0)
Triglycerides: 263 mg/dL — ABNORMAL HIGH (ref <150)

## 2024-03-16 LAB — COMPREHENSIVE METABOLIC PANEL WITH GFR
AG Ratio: 1.8 (calc) (ref 1.0–2.5)
ALT: 20 U/L (ref 6–29)
AST: 19 U/L (ref 10–35)
Albumin: 4.8 g/dL (ref 3.6–5.1)
Alkaline phosphatase (APISO): 69 U/L (ref 37–153)
BUN/Creatinine Ratio: 13 (calc) (ref 6–22)
BUN: 14 mg/dL (ref 7–25)
CO2: 23 mmol/L (ref 20–32)
Calcium: 10.1 mg/dL (ref 8.6–10.4)
Chloride: 105 mmol/L (ref 98–110)
Creat: 1.05 mg/dL — ABNORMAL HIGH (ref 0.50–1.03)
Globulin: 2.7 g/dL (ref 1.9–3.7)
Glucose, Bld: 101 mg/dL — ABNORMAL HIGH (ref 65–99)
Potassium: 4.1 mmol/L (ref 3.5–5.3)
Sodium: 140 mmol/L (ref 135–146)
Total Bilirubin: 0.4 mg/dL (ref 0.2–1.2)
Total Protein: 7.5 g/dL (ref 6.1–8.1)
eGFR: 61 mL/min/1.73m2 (ref >=60)

## 2024-03-16 LAB — VITAMIN D 25 HYDROXY (VIT D DEFICIENCY, FRACTURES): Vit D, 25-Hydroxy: 67 ng/mL (ref 30–100)

## 2024-03-16 LAB — HEMOGLOBIN A1C
Hgb A1c MFr Bld: 5.7 % — ABNORMAL HIGH (ref <5.7)
Mean Plasma Glucose: 117 mg/dL
eAG (mmol/L): 6.5 mmol/L

## 2024-03-16 LAB — TSH: TSH: 1.43 m[IU]/L (ref 0.40–4.50)

## 2024-03-17 ENCOUNTER — Other Ambulatory Visit: Payer: Self-pay

## 2024-03-17 DIAGNOSIS — M8580 Other specified disorders of bone density and structure, unspecified site: Secondary | ICD-10-CM

## 2024-03-17 NOTE — Addendum Note (Signed)
 Addended by: CORINNA BRISKER R on: 03/17/2024 08:30 AM   Modules accepted: Orders

## 2024-03-29 ENCOUNTER — Ambulatory Visit (HOSPITAL_COMMUNITY)
Admission: RE | Admit: 2024-03-29 | Discharge: 2024-03-29 | Disposition: A | Discharge status: Home / Self Care | Source: Ambulatory Visit | Attending: Family Medicine | Admitting: Family Medicine

## 2024-03-29 ENCOUNTER — Other Ambulatory Visit: Payer: Self-pay | Admitting: Family Medicine

## 2024-03-29 DIAGNOSIS — M8580 Other specified disorders of bone density and structure, unspecified site: Secondary | ICD-10-CM | POA: Insufficient documentation

## 2024-03-29 DIAGNOSIS — Z0000 Encounter for general adult medical examination without abnormal findings: Secondary | ICD-10-CM

## 2024-03-30 ENCOUNTER — Ambulatory Visit: Payer: Self-pay | Admitting: Family Medicine

## 2024-04-06 ENCOUNTER — Encounter: Admitting: Family Medicine

## 2024-04-06 ENCOUNTER — Other Ambulatory Visit: Payer: Self-pay | Admitting: Family Medicine

## 2024-04-06 DIAGNOSIS — E7800 Pure hypercholesterolemia, unspecified: Secondary | ICD-10-CM

## 2024-04-07 NOTE — Telephone Encounter (Signed)
 Requested Prescriptions  Pending Prescriptions Disp Refills   omega-3 acid ethyl esters (LOVAZA ) 1 g capsule [Pharmacy Med Name: OMEGA-3-ACID  CAP 1GM(L)] 180 capsule 0    Sig: TAKE 2 CAPSULES DAILY     Endocrinology:  Nutritional Agents - omega-3 acid ethyl esters Failed - 04/07/2024  1:40 PM      Failed - Lipid Panel in normal range within the last 12 months    Cholesterol  Date Value Ref Range Status  03/15/2024 339 (H) <200 mg/dL Final   LDL Cholesterol (Calc)  Date Value Ref Range Status  03/15/2024 244 (H) mg/dL (calc) Final    Comment:    LDL-C levels > or = 190 mg/dL may indicate familial  hypercholesterolemia (FH). Clinical assessment and  measurement of blood lipid levels should be  considered for all first degree relatives of patients with an FH diagnosis. LDL Cholesterol (LDL-C) levels > or = 300 mg/dL may indicate homozygous familial hypercholesterolemia (HoFH). Untreated,  these extremely high LDL-C levels can result in premature CV events and mortality. Patients should be identified early and provided appropriate interventions to reduce the cumulative LDL-C burden from birth. . For questions about testing for familial hypercholesterolemia, please call Engineer, materials at [email or web address].GENE.INFO. SABRA Veatrice DASEN, et al. J National Lipid Association  Recommendations for Patient-Centered Management of Dyslipidemia: Part 1 Journal of  Clinical Lipidology 2015;9(2), 129-169. Cuchel, M. et al. (2014). Homozygous familial hypercholesterolaemia: new insights and guidance for clinicians to improve detection and clinical management. European Heart Journal, 35(32), [phone]. Reference range: <100 . Desirable range <100 mg/dL for primary prevention;   <70 mg/dL for patients with CHD or diabetic patients  with > or = 2 CHD risk factors. SABRA LDL-C is now calculated using the Martin-Hopkins  calculation, which is a validated novel method providing  better accuracy  than the Friedewald equation in the  estimation of LDL-C.  Gladis APPLETHWAITE et al. SANDREA. 7986;689(80): 2061-2068  (http://education.Quest[employer].com/faq/FAQ164)    HDL  Date Value Ref Range Status  03/15/2024 48 (L) > OR = 50 mg/dL Final   Triglycerides  Date Value Ref Range Status  03/15/2024 263 (H) <150 mg/dL Final    Comment:    . If a non-fasting specimen was collected, consider repeat triglyceride testing on a fasting specimen if clinically indicated.  Veatrice et al. J. of Clin. Lipidol. 2015;9:129-169. SABRA          Passed - Valid encounter within last 12 months    Recent Outpatient Visits           3 weeks ago Class 1 obesity without serious comorbidity with body mass index (BMI) of 30.0 to 30.9 in adult, unspecified obesity type   [place] [facility] Kayla Jeoffrey RAMAN, FNP   4 months ago Onychomycosis   [place] [facility] Kayla Jeoffrey RAMAN, FNP   1 year ago Postmenopausal estrogen deficiency   [place] [facility] Duanne Butler DASEN, MD   2 years ago Severe episode of recurrent major depressive disorder, without psychotic features [facility])   [place] [facility] Duanne Butler DASEN, MD   2 years ago Severe episode of recurrent major depressive disorder, without psychotic features [facility])   [place] [facility] Pickard, Butler DASEN, MD

## 2024-04-18 ENCOUNTER — Other Ambulatory Visit: Payer: Self-pay | Admitting: Family Medicine

## 2024-04-19 ENCOUNTER — Other Ambulatory Visit: Payer: Self-pay | Admitting: Family Medicine

## 2024-04-21 NOTE — Telephone Encounter (Signed)
 Requested medication (s) are due for refill today: no  Requested medication (s) are on the active medication list: yes  Last refill:  04/18/24  Future visit scheduled: yes  Notes to clinic:  harmacy comment: Please verify the number of headaches per month, quantity and day supply for this medication.      Requested Prescriptions  Pending Prescriptions Disp Refills   IMITREX  100 MG tablet [Pharmacy Med Name: IMITREX  TAB 100MG ]  3    Sig: TAKE 1 TABLET DAILY AS     NEEDED. MAY REPEAT IN 2    HOURS IF NO BETTER. (DR     REDDYS)     Neurology:  Migraine Therapy - Triptan Passed - 04/21/2024  3:41 PM      Passed - Last BP in normal range    BP Readings from Last 1 Encounters:  03/15/24 125/89         Passed - Valid encounter within last 12 months    Recent Outpatient Visits           1 month ago Class 1 obesity without serious comorbidity with body mass index (BMI) of 30.0 to 30.9 in adult, unspecified obesity type   [place] [facility] Kayla Jeoffrey RAMAN, FNP   5 months ago Onychomycosis   [place] [facility] Kayla Jeoffrey RAMAN, FNP   1 year ago Postmenopausal estrogen deficiency   [place] [facility] Duanne Butler DASEN, MD   2 years ago Severe episode of recurrent major depressive disorder, without psychotic features [facility])   [place] [facility] Duanne Butler DASEN, MD   2 years ago Severe episode of recurrent major depressive disorder, without psychotic features [facility])   [place] [facility] Pickard, Butler DASEN, MD

## 2024-04-26 ENCOUNTER — Encounter: Admitting: Family Medicine

## 2024-05-01 LAB — HM COLONOSCOPY

## 2024-05-03 ENCOUNTER — Telehealth: Payer: Self-pay | Admitting: Lab

## 2024-05-03 NOTE — Telephone Encounter (Signed)
 Patient states she is running out of insurance and would like fungal infection medication called in I explained she may needs labs prior to refill. Please order labs so we can try to expedite refill for her.

## 2024-05-05 ENCOUNTER — Ambulatory Visit: Admitting: Family Medicine

## 2024-05-05 ENCOUNTER — Encounter: Payer: Self-pay | Admitting: Family Medicine

## 2024-05-05 ENCOUNTER — Other Ambulatory Visit: Payer: Self-pay | Admitting: Podiatry

## 2024-05-05 VITALS — BP 126/76 | HR 76 | Temp 97.8°F | Ht 63.0 in | Wt 172.6 lb

## 2024-05-05 DIAGNOSIS — K921 Melena: Secondary | ICD-10-CM | POA: Insufficient documentation

## 2024-05-05 DIAGNOSIS — F411 Generalized anxiety disorder: Secondary | ICD-10-CM

## 2024-05-05 DIAGNOSIS — E7801 Familial hypercholesterolemia: Secondary | ICD-10-CM

## 2024-05-05 DIAGNOSIS — Z0001 Encounter for general adult medical examination with abnormal findings: Secondary | ICD-10-CM

## 2024-05-05 DIAGNOSIS — B351 Tinea unguium: Secondary | ICD-10-CM

## 2024-05-05 DIAGNOSIS — Z83719 Family history of colon polyps, unspecified: Secondary | ICD-10-CM | POA: Insufficient documentation

## 2024-05-05 DIAGNOSIS — M8580 Other specified disorders of bone density and structure, unspecified site: Secondary | ICD-10-CM

## 2024-05-05 DIAGNOSIS — Z853 Personal history of malignant neoplasm of breast: Secondary | ICD-10-CM

## 2024-05-05 DIAGNOSIS — Z0000 Encounter for general adult medical examination without abnormal findings: Secondary | ICD-10-CM

## 2024-05-05 MED ORDER — TERBINAFINE HCL 250 MG PO TABS: 250.0000 mg | ORAL_TABLET | Freq: Every day | ORAL | 0 refills | Status: DC

## 2024-05-05 MED ORDER — ROSUVASTATIN CALCIUM 10 MG PO TABS: 10.0000 mg | ORAL_TABLET | Freq: Every day | ORAL | 3 refills | Status: DC

## 2024-05-05 NOTE — Progress Notes (Signed)
 Will extend partial fill of Terbinafine . Ordering Liver function panel will notify patient of pertinent results.

## 2024-05-05 NOTE — Progress Notes (Signed)
 Subjective:    Patient ID: Luke LELON Sierras, female    DOB: [birth year], 58 y.o.   MRN: [id number]  Patient has a history of breast cancer and underwent double mastectomy.  Due to the presence of the BR CA gene, the patient also underwent a hysterectomy with oophorectomy.  Therefore she does not require mammogram or Pap smear.  Her colonoscopy was performed earlier this month and did show 3 polyps.  Biopsy results are pending.  She had a bone density test earlier this year with a T-score of -2.3.  She is taking calcium  and vitamin D .  Her most recent lab work is listed below.  She is due for a pneumonia shot, shingles vaccine, tetanus shot, flu shot, and a COVID shot.  She defers the vaccines today No visits with results within 1 Month(s) from this visit.  Latest known visit with results is:  Office Visit on 03/15/2024  Component Date Value Ref Range Status   Glucose, Bld 03/15/2024 101 (H)  65 - 99 mg/dL Final   Comment: .            Fasting reference interval . For someone without known diabetes, a glucose value between 100 and 125 mg/dL is consistent with prediabetes and should be confirmed with a follow-up test. .    BUN 03/15/2024 14  7 - 25 mg/dL Final   Creat 92/90/7974 1.05 (H)  0.50 - 1.03 mg/dL Final   eGFR 92/90/7974 61  > OR = 60 mL/min/1.28m2 Final   BUN/Creatinine Ratio 03/15/2024 13  6 - 22 (calc) Final   Sodium 03/15/2024 140  135 - 146 mmol/L Final   Potassium 03/15/2024 4.1  3.5 - 5.3 mmol/L Final   Chloride 03/15/2024 105  98 - 110 mmol/L Final   CO2 03/15/2024 23  20 - 32 mmol/L Final   Calcium  03/15/2024 10.1  8.6 - 10.4 mg/dL Final   Total Protein 92/90/7974 7.5  6.1 - 8.1 g/dL Final   Albumin 92/90/7974 4.8  3.6 - 5.1 g/dL Final   Globulin 92/90/7974 2.7  1.9 - 3.7 g/dL (calc) Final   AG Ratio 03/15/2024 1.8  1.0 - 2.5 (calc) Final   Total Bilirubin 03/15/2024 0.4  0.2 - 1.2 mg/dL Final   Alkaline phosphatase (APISO) 03/15/2024 69  37 - 153 U/L Final   AST  03/15/2024 19  10 - 35 U/L Final   ALT 03/15/2024 20  6 - 29 U/L Final   TSH 03/15/2024 1.43  0.40 - 4.50 mIU/L Final   Hgb A1c MFr Bld 03/15/2024 5.7 (H)  <5.7 % Final   Comment: For someone without known diabetes, a hemoglobin  A1c value between 5.7% and 6.4% is consistent with prediabetes and should be confirmed with a  follow-up test. . For someone with known diabetes, a value <7% indicates that their diabetes is well controlled. A1c targets should be individualized based on duration of diabetes, age, comorbid conditions, and other considerations. . This assay result is consistent with an increased risk of diabetes. . Currently, no consensus exists regarding use of hemoglobin A1c for diagnosis of diabetes for children. .    Mean Plasma Glucose 03/15/2024 117  mg/dL Final   eAG (mmol/L) 92/90/7974 6.5  mmol/L Final   Vit D, 25-Hydroxy 03/15/2024 67  30 - 100 ng/mL Final   Comment: Vitamin D  Status         25-OH Vitamin D : . Deficiency:                    <  20 ng/mL Insufficiency:             20 - 29 ng/mL Optimal:                 > or = 30 ng/mL . For 25-OH Vitamin D  testing on patients on  D2-supplementation and patients for whom quantitation  of D2 and D3 fractions is required, the QuestAssureD(TM) 25-OH VIT D, (D2,D3), LC/MS/MS is recommended: order  code 07111 (patients >40yrs). . See Note 1 . Note 1 . For additional information, please refer to  http://education.[facility].com/faq/FAQ199  (This link is being provided for informational/ educational purposes only.)    WBC 03/15/2024 4.8  3.8 - 10.8 Thousand/uL Final   RBC 03/15/2024 5.27 (H)  3.80 - 5.10 Million/uL Final   Hemoglobin 03/15/2024 14.9  11.7 - 15.5 g/dL Final   HCT 92/90/7974 45.5 (H)  35.0 - 45.0 % Final   MCV 03/15/2024 86.3  80.0 - 100.0 fL Final   MCH 03/15/2024 28.3  27.0 - 33.0 pg Final   MCHC 03/15/2024 32.7  32.0 - 36.0 g/dL Final   Comment: For adults, a slight decrease in the  calculated MCHC value (in the range of 30 to 32 g/dL) is most likely not clinically significant; however, it should be interpreted with caution in correlation with other red cell parameters and the patient's clinical condition.    RDW 03/15/2024 12.8  11.0 - 15.0 % Final   Platelets 03/15/2024 261  140 - 400 Thousand/uL Final   MPV 03/15/2024 10.3  7.5 - 12.5 fL Final   Neutro Abs 03/15/2024 3,182  1,500 - 7,800 cells/uL Final   Absolute Lymphocytes 03/15/2024 1,262  850 - 3,900 cells/uL Final   Absolute Monocytes 03/15/2024 326  200 - 950 cells/uL Final   Eosinophils Absolute 03/15/2024 0 (L)  15 - 500 cells/uL Final   Basophils Absolute 03/15/2024 29  0 - 200 cells/uL Final   Neutrophils Relative % 03/15/2024 66.3  % Final   Total Lymphocyte 03/15/2024 26.3  % Final   Monocytes Relative 03/15/2024 6.8  % Final   Eosinophils Relative 03/15/2024 0.0  % Final   Basophils Relative 03/15/2024 0.6  % Final   Cholesterol 03/15/2024 339 (H)  <200 mg/dL Final   HDL 92/90/7974 48 (L)  > OR = 50 mg/dL Final   Triglycerides 92/90/7974 263 (H)  <150 mg/dL Final   Comment: . If a non-fasting specimen was collected, consider repeat triglyceride testing on a fasting specimen if clinically indicated.  Veatrice et al. J. of Clin. Lipidol. 2015;9:129-169. SABRA    LDL Cholesterol (Calc) 03/15/2024 244 (H)  mg/dL (calc) Final   Comment: LDL-C levels > or = 190 mg/dL may indicate familial  hypercholesterolemia (FH). Clinical assessment and  measurement of blood lipid levels should be  considered for all first degree relatives of patients with an FH diagnosis. LDL Cholesterol (LDL-C) levels > or = 300 mg/dL may indicate homozygous familial hypercholesterolemia (HoFH). Untreated,  these extremely high LDL-C levels can result in premature CV events and mortality. Patients should be identified early and provided appropriate interventions to reduce the cumulative LDL-C burden from birth. . For  questions about testing for familial hypercholesterolemia, please call Engineer, materials at [email or web address].GENE.INFO. SABRA Veatrice DASEN, et al. J National Lipid Association  Recommendations for Patient-Centered Management of Dyslipidemia: Part 1 Journal of  Clinical Lipidology 2015;9(2), 129-169. Cuchel, M. et al. (2014). Homozygous familial hypercholesterolaemia: new insights and guidance for clinicians to impro  ve detection and clinical management. European Heart Journal, 35(32), 857 732 2008. Reference range: <100 . Desirable range <100 mg/dL for primary prevention;   <70 mg/dL for patients with CHD or diabetic patients  with > or = 2 CHD risk factors. SABRA LDL-C is now calculated using the Martin-Hopkins  calculation, which is a validated novel method providing  better accuracy than the Friedewald equation in the  estimation of LDL-C.  Gladis APPLETHWAITE et al. SANDREA. 7986;689(80): 2061-2068  (http://education.[facility].com/faq/FAQ164)    Total CHOL/HDL Ratio 03/15/2024 7.1 (H)  <5.0 (calc) Final   Non-HDL Cholesterol (Calc) 03/15/2024 291 (H)  <130 mg/dL (calc) Final   Comment: Non-HDL level > or = 220 is very high and may indicate  genetic familial hypercholesterolemia (FH). Clinical  assessment and measurement of blood lipid levels  should be considered for all first-degree relatives  of patients with an FH diagnosis. . For patients with diabetes plus 1 major ASCVD risk  factor, treating to a non-HDL-C goal of <100 mg/dL  (LDL-C of <29 mg/dL) is considered a therapeutic  option.     Past Medical History:  Diagnosis Date   Anxiety    Cancer (HCC)    breast cancer   Complication of anesthesia    Depression    Headache(784.0)    Motion sickness    PONV (postoperative nausea and vomiting)    Past Surgical History:  Procedure Laterality Date   ABDOMINAL HYSTERECTOMY  2007   BSO   BREAST IMPLANT REMOVAL Bilateral 07/15/2017   Procedure: REMOVAL  BREAST IMPLANTS;  Surgeon: Lowery Estefana RAMAN, DO;  Location: [facility];  Service: Plastics;  Laterality: Bilateral;   BREAST RECONSTRUCTION  2005   implants-bilat   CARPAL TUNNEL RELEASE Right 05/16/2013   Procedure: CARPAL TUNNEL RELEASE RIGHT ;  Surgeon: Lamar LULLA Leonor Mickey., MD;  Location: [facility];  Service: Orthopedics;  Laterality: Right;   CARPAL TUNNEL RELEASE Left 08/08/2013   Procedure: LEFT CARPAL TUNNEL RELEASE;  Surgeon: Lamar LULLA Leonor Mickey., MD;  Location: [facility];  Service: Orthopedics;  Laterality: Left;   CESAREAN SECTION     x4   MASTECTOMY W/ SENTINEL NODE BIOPSY  2005   left-   PLACEMENT OF BREAST IMPLANTS Bilateral 07/15/2017   Procedure: PLACEMENT OF BREAST IMPLANTS;  Surgeon: Lowery Estefana RAMAN, DO;  Location: [facility];  Service: Plastics;  Laterality: Bilateral;   PORT-A-CATH REMOVAL  2007   insertion then out   SIMPLE MASTECTOMY  2005   right-prophalactic   TONSILLECTOMY     Current Outpatient Medications on File Prior to Visit  Medication Sig Dispense Refill   BIOTIN PO Take by mouth.     buPROPion (WELLBUTRIN SR) 150 MG 12 hr tablet Take 150 mg by mouth every morning.     estradiol  (ESTRACE ) 0.1 MG/GM vaginal cream INSERT 0.5 GRAMS VAGINALLY 2 TIMES A WEEK 42.5 g 1   Hormone Cream Base (HRT BASE) CREA TESTOSTERONE  HRT ( ) 5% CREAM- APPLY A PEA-SIZE AMOUNT TO LABIA 5X WEEKLY 1000 g 3   lamoTRIgine  (LAMICTAL ) 200 MG tablet Take 200 mg by mouth daily.     MELATONIN PO Take by mouth.     omega-3 acid ethyl esters (LOVAZA ) 1 g capsule TAKE 2 CAPSULES DAILY 180 capsule 0   REXULTI 2 MG TABS tablet Take 2 mg by mouth daily.     SUMAtriptan  (IMITREX ) 100 MG tablet TAKE 1 TABLET DAILY AS     NEEDED. MAY REPEAT IN 2  HOURS IF NO BETTER (DR     REDDYS) 90 tablet 3   terbinafine  (LAMISIL ) 250 MG tablet Take 1 tablet (250 mg total) by mouth daily. 90 tablet 0   terbinafine  (LAMISIL ) 250 MG tablet Take  250 mg by mouth daily.     TESTOSTERONE  PROPIONATE TD 2 % by Subdermal route daily as needed (3-5 times weekly).     TRINTELLIX  20 MG TABS tablet Take 20 mg by mouth daily.     tirzepatide  (ZEPBOUND ) 2.5 MG/0.5ML Pen Inject 2.5 mg into the skin once a week. (Patient not taking: Reported on 05/05/2024) 2 mL 0   No current facility-administered medications on file prior to visit.   No Known Allergies Social History   Socioeconomic History   Marital status: Married    Spouse name: Not on file   Number of children: Not on file   Years of education: Not on file   Highest education level: Not on file  Occupational History   Not on file  Tobacco Use   Smoking status: Never   Smokeless tobacco: Never  Vaping Use   Vaping status: Never Used  Substance and Sexual Activity   Alcohol use: No   Drug use: No   Sexual activity: Yes  Other Topics Concern   Not on file  Social History Narrative   Not on file   Social Drivers of Health   Financial Resource Strain: Not on file  Food Insecurity: Not on file  Transportation Needs: Not on file  Physical Activity: Not on file  Stress: Not on file  Social Connections: Not on file  Intimate Partner Violence: Not on file      Review of Systems  All other systems reviewed and are negative.      Objective:   Physical Exam Vitals reviewed.  Constitutional:      General: She is not in acute distress.    Appearance: Normal appearance. She is not ill-appearing or toxic-appearing.  HENT:     Right Ear: Tympanic membrane and ear canal normal.     Left Ear: Tympanic membrane and ear canal normal.     Nose: No congestion or rhinorrhea.     Mouth/Throat:     Pharynx: No oropharyngeal exudate or posterior oropharyngeal erythema.  Neck:     Vascular: No carotid bruit.  Cardiovascular:     Rate and Rhythm: Normal rate and regular rhythm.     Heart sounds: Normal heart sounds. No murmur heard.    No friction rub. No gallop.  Pulmonary:      Effort: Pulmonary effort is normal. No respiratory distress.     Breath sounds: Normal breath sounds. No stridor. No wheezing, rhonchi or rales.  Abdominal:     General: Bowel sounds are normal. There is no distension.     Palpations: Abdomen is soft.     Tenderness: There is no abdominal tenderness. There is no guarding or rebound.  Musculoskeletal:     Right lower leg: No edema.     Left lower leg: No edema.  Lymphadenopathy:     Cervical: No cervical adenopathy.  Neurological:     General: No focal deficit present.     Mental Status: She is alert and oriented to person, place, and time.     Motor: No weakness.     Coordination: Coordination normal.     Gait: Gait normal.     Deep Tendon Reflexes: Reflexes normal.  Psychiatric:        Attention  and Perception: Attention and perception normal. She is attentive. She does not perceive auditory or visual hallucinations.        Mood and Affect: Mood is not anxious, depressed or elated. Affect is not labile, blunt, flat, angry, tearful or inappropriate.        Speech: Speech normal. She is communicative. Speech is not rapid and pressured, delayed or slurred.        Behavior: Behavior normal.        Thought Content: Thought content normal.        Cognition and Memory: Cognition and memory normal.        Judgment: Judgment normal. Judgment is not impulsive or inappropriate.           Assessment & Plan:  General medical exam  Familial hypercholesterolemia  History of breast cancer  GAD (generalized anxiety disorder)  Osteopenia, unspecified location I explained to the patient the severity of her hyperlipidemia.  She is hesitant to take a statin.  Her coronary artery calcium  score was 0.  While reassuring, that does not preclude any cardiovascular risk.  Patient agrees to take Crestor  10 mg daily and recheck cholesterol in 3 to 6 months.  Cancer screening is up-to-date.  She is taking calcium  and vitamin D  for osteopenia.  Repeat  bone density in 3 years.  Blood pressure is acceptable.

## 2024-05-12 ENCOUNTER — Ambulatory Visit: Admitting: Podiatry

## 2024-05-19 ENCOUNTER — Ambulatory Visit: Admitting: Podiatry

## 2024-05-31 ENCOUNTER — Encounter: Payer: Self-pay | Admitting: Family Medicine

## 2024-06-01 ENCOUNTER — Other Ambulatory Visit: Payer: Self-pay | Admitting: Family Medicine

## 2024-06-01 MED ORDER — PHENTERMINE HCL 37.5 MG PO TABS: 37.5000 mg | ORAL_TABLET | Freq: Every day | ORAL | 2 refills | Status: AC

## 2024-06-02 ENCOUNTER — Ambulatory Visit: Admitting: Podiatry

## 2024-06-02 ENCOUNTER — Encounter: Payer: Self-pay | Admitting: Podiatry

## 2024-06-02 DIAGNOSIS — Z79899 Other long term (current) drug therapy: Secondary | ICD-10-CM | POA: Diagnosis not present

## 2024-06-02 DIAGNOSIS — M79674 Pain in right toe(s): Secondary | ICD-10-CM | POA: Diagnosis not present

## 2024-06-02 DIAGNOSIS — M79675 Pain in left toe(s): Secondary | ICD-10-CM

## 2024-06-02 DIAGNOSIS — B351 Tinea unguium: Secondary | ICD-10-CM | POA: Diagnosis not present

## 2024-06-02 MED ORDER — TERBINAFINE HCL 250 MG PO TABS: 250.0000 mg | ORAL_TABLET | Freq: Every day | ORAL | 2 refills | Status: AC

## 2024-06-02 NOTE — Progress Notes (Signed)
 Chief Complaint  Patient presents with   Nail Problem    3 months (around 05/13/2024) for Onychomycosis/med check. Yellowing is better, but has turned white. New growth looks clear.  Has not gone for blood work yet.  Not diabetic no anti coag    HPI: 58 y.o. female presents today following up for onychomycosis.  She has completed 2nd 90 course of terbinafine , she has not obtained her lab work.  She does report improvement to the yellow discoloration and thickening however she does notice a chalky white substance on top of the nails.  The new nail growth approximately does appear improved.  Past Medical History:  Diagnosis Date   Anxiety    Cancer (HCC)    breast cancer   Complication of anesthesia    Depression    Headache(784.0)    Motion sickness    PONV (postoperative nausea and vomiting)     Past Surgical History:  Procedure Laterality Date   ABDOMINAL HYSTERECTOMY  2007   BSO   BREAST IMPLANT REMOVAL Bilateral 07/15/2017   Procedure: REMOVAL BREAST IMPLANTS;  Surgeon: Lowery Estefana RAMAN, DO;  Location: [facility];  Service: Plastics;  Laterality: Bilateral;   BREAST RECONSTRUCTION  2005   implants-bilat   CARPAL TUNNEL RELEASE Right 05/16/2013   Procedure: CARPAL TUNNEL RELEASE RIGHT ;  Surgeon: Lamar LULLA Leonor Mickey., MD;  Location: [facility];  Service: Orthopedics;  Laterality: Right;   CARPAL TUNNEL RELEASE Left 08/08/2013   Procedure: LEFT CARPAL TUNNEL RELEASE;  Surgeon: Lamar LULLA Leonor Mickey., MD;  Location: [facility];  Service: Orthopedics;  Laterality: Left;   CESAREAN SECTION     x4   MASTECTOMY W/ SENTINEL NODE BIOPSY  2005   left-   PLACEMENT OF BREAST IMPLANTS Bilateral 07/15/2017   Procedure: PLACEMENT OF BREAST IMPLANTS;  Surgeon: Lowery Estefana RAMAN, DO;  Location: [facility];  Service: Plastics;  Laterality: Bilateral;   PORT-A-CATH REMOVAL  2007   insertion then out   SIMPLE MASTECTOMY  2005    right-prophalactic   TONSILLECTOMY      No Known Allergies  ROS    Physical Exam: There were no vitals filed for this visit.  General: The patient is alert and oriented x3 in no acute distress.  Dermatology: Skin is warm, dry and supple bilateral lower extremities. Interspaces are clear of maceration and debris.  Nail plates overall showing signs of clear nail growth.  Improvement of the yellow discoloration and says nail plates.  The nail plates x 10 do have chalky white appearance to the central aspect of the nail plates, proximal nail plates do appear to be growing out clear.  Vascular: Palpable pedal pulses bilaterally. Capillary refill within normal limits.  No appreciable edema.  No erythema or calor.  Neurological: Light touch sensation grossly intact bilateral feet.   Musculoskeletal Exam: No pedal deformities noted   Assessment/Plan of Care: 1. Pain due to onychomycosis of toenails of both feet      Meds ordered this encounter  Medications   terbinafine  (LAMISIL ) 250 MG tablet    Sig: Take 1 tablet (250 mg total) by mouth daily.    Dispense:  30 tablet    Refill:  2   None  Discussed clinical findings with patient today.  At this point recommend sending new nail specimen due to the chalky white appearance of the nails and decreased thickening and yellow discoloration.    Obtaining  lab work, did discuss the importance of this as she had not obtained previous labs.  Will tentatively plan to extend the terbinafine  on a month by month basis with monthly lab work.  Will notify patient of any pertinent results regarding lab work or needing to change therapy.  Follow-up in 1 month for onychomycosis/med check.   Kristine Ortega, AACFAS [facility]     [address] [place], [place] [address]                Office [phone]  Fax [phone]

## 2024-06-03 LAB — HEPATIC FUNCTION PANEL
AG Ratio: 1.9 (calc) (ref 1.0–2.5)
ALT: 28 U/L (ref 6–29)
AST: 20 U/L (ref 10–35)
Albumin: 4.6 g/dL (ref 3.6–5.1)
Alkaline phosphatase (APISO): 64 U/L (ref 37–153)
Bilirubin, Direct: 0.1 mg/dL (ref 0.0–0.2)
Globulin: 2.4 g/dL (ref 1.9–3.7)
Indirect Bilirubin: 0.2 mg/dL (ref 0.2–1.2)
Total Bilirubin: 0.3 mg/dL (ref 0.2–1.2)
Total Protein: 7 g/dL (ref 6.1–8.1)

## 2024-06-03 LAB — CBC
HCT: 42.2 % (ref 35.0–45.0)
Hemoglobin: 14.2 g/dL (ref 11.7–15.5)
MCH: 29 pg (ref 27.0–33.0)
MCHC: 33.6 g/dL (ref 32.0–36.0)
MCV: 86.1 fL (ref 80.0–100.0)
MPV: 10.4 fL (ref 7.5–12.5)
Platelets: 234 Thousand/uL (ref 140–400)
RBC: 4.9 Million/uL (ref 3.80–5.10)
RDW: 13 % (ref 11.0–15.0)
WBC: 6.5 Thousand/uL (ref 3.8–10.8)

## 2024-06-05 ENCOUNTER — Ambulatory Visit: Payer: Self-pay | Admitting: Podiatry

## 2024-06-08 ENCOUNTER — Other Ambulatory Visit: Payer: Self-pay | Admitting: Podiatry

## 2024-06-12 ENCOUNTER — Other Ambulatory Visit: Payer: Self-pay | Admitting: Podiatry

## 2024-06-12 ENCOUNTER — Encounter: Payer: Self-pay | Admitting: Podiatry

## 2024-06-12 DIAGNOSIS — B351 Tinea unguium: Secondary | ICD-10-CM

## 2024-06-12 MED ORDER — CICLOPIROX 8 % EX SOLN: Freq: Every day | CUTANEOUS | 12 refills | Status: AC

## 2024-06-12 NOTE — Progress Notes (Signed)
 Penlac sent to patient's pharmacy

## 2024-06-29 ENCOUNTER — Encounter: Payer: Self-pay | Admitting: Family Medicine

## 2024-07-04 ENCOUNTER — Encounter: Payer: Self-pay | Admitting: Family Medicine

## 2024-07-15 ENCOUNTER — Encounter: Payer: Self-pay | Admitting: Family Medicine

## 2024-07-17 ENCOUNTER — Ambulatory Visit

## 2024-07-17 DIAGNOSIS — Z23 Encounter for immunization: Secondary | ICD-10-CM | POA: Diagnosis not present

## 2024-07-17 NOTE — Progress Notes (Signed)
 Patient is in office today for a nurse visit for Immunization. Patient Injection was given in the  Right deltoid. Patient tolerated injection well. Pt received shingrix in R delt and flu in L delt. Mjp,lpn

## 2024-07-26 ENCOUNTER — Encounter: Payer: Self-pay | Admitting: Family Medicine

## 2024-07-26 ENCOUNTER — Other Ambulatory Visit: Payer: Self-pay

## 2024-07-26 DIAGNOSIS — E7800 Pure hypercholesterolemia, unspecified: Secondary | ICD-10-CM

## 2024-07-26 DIAGNOSIS — E66811 Obesity, class 1: Secondary | ICD-10-CM

## 2024-07-26 DIAGNOSIS — E78019 Familial hypercholesterolemia, unspecified: Secondary | ICD-10-CM

## 2024-07-26 MED ORDER — ROSUVASTATIN CALCIUM 10 MG PO TABS: 10.0000 mg | ORAL_TABLET | Freq: Every day | ORAL | 1 refills | Status: AC

## 2024-08-17 ENCOUNTER — Other Ambulatory Visit: Payer: Self-pay | Admitting: Family Medicine

## 2024-09-02 ENCOUNTER — Other Ambulatory Visit: Payer: Self-pay | Admitting: Family Medicine

## 2024-09-06 ENCOUNTER — Encounter: Payer: Self-pay | Admitting: Family Medicine

## 2024-09-11 ENCOUNTER — Other Ambulatory Visit: Payer: Self-pay | Admitting: Family Medicine

## 2024-09-11 DIAGNOSIS — E7800 Pure hypercholesterolemia, unspecified: Secondary | ICD-10-CM

## 2024-09-27 ENCOUNTER — Encounter: Payer: Self-pay | Admitting: Family Medicine

## 2024-10-12 ENCOUNTER — Encounter: Payer: Self-pay | Admitting: Family Medicine
